# Patient Record
Sex: Male | Born: 1990 | Race: White | Hispanic: No | Marital: Single | Smoking: Never smoker
Health system: Southern US, Community
[De-identification: ages and names within clinical notes are randomized; demographics above are authoritative.]

## PROBLEM LIST (undated history)

## (undated) DIAGNOSIS — K589 Irritable bowel syndrome without diarrhea: Secondary | ICD-10-CM

## (undated) DIAGNOSIS — G8929 Other chronic pain: Secondary | ICD-10-CM

## (undated) DIAGNOSIS — F4310 Post-traumatic stress disorder, unspecified: Secondary | ICD-10-CM

## (undated) DIAGNOSIS — F32A Depression, unspecified: Secondary | ICD-10-CM

## (undated) DIAGNOSIS — F429 Obsessive-compulsive disorder, unspecified: Secondary | ICD-10-CM

## (undated) DIAGNOSIS — F329 Major depressive disorder, single episode, unspecified: Secondary | ICD-10-CM

## (undated) DIAGNOSIS — G4730 Sleep apnea, unspecified: Secondary | ICD-10-CM

## (undated) DIAGNOSIS — R51 Headache: Secondary | ICD-10-CM

## (undated) DIAGNOSIS — K219 Gastro-esophageal reflux disease without esophagitis: Secondary | ICD-10-CM

## (undated) DIAGNOSIS — Q213 Tetralogy of Fallot: Secondary | ICD-10-CM

## (undated) DIAGNOSIS — F419 Anxiety disorder, unspecified: Secondary | ICD-10-CM

## (undated) DIAGNOSIS — R5383 Other fatigue: Secondary | ICD-10-CM

## (undated) DIAGNOSIS — R519 Headache, unspecified: Secondary | ICD-10-CM

## (undated) DIAGNOSIS — Q249 Congenital malformation of heart, unspecified: Secondary | ICD-10-CM

## (undated) HISTORY — PX: OTHER SURGICAL HISTORY: SHX169

## (undated) HISTORY — DX: Headache, unspecified: R51.9

## (undated) HISTORY — DX: Other chronic pain: G89.29

## (undated) HISTORY — DX: Depression, unspecified: F32.A

## (undated) HISTORY — DX: Congenital malformation of heart, unspecified: Q24.9

## (undated) HISTORY — DX: Anxiety disorder, unspecified: F41.9

## (undated) HISTORY — PX: TETRALOGY OF FALLOT REPAIR: SHX796

## (undated) HISTORY — DX: Tetralogy of Fallot: Q21.3

## (undated) HISTORY — DX: Gastro-esophageal reflux disease without esophagitis: K21.9

## (undated) HISTORY — DX: Post-traumatic stress disorder, unspecified: F43.10

## (undated) HISTORY — DX: Headache: R51

## (undated) HISTORY — PX: TYMPANOSTOMY TUBE PLACEMENT: SHX32

## (undated) HISTORY — DX: Sleep apnea, unspecified: G47.30

## (undated) HISTORY — DX: Irritable bowel syndrome, unspecified: K58.9

## (undated) HISTORY — DX: Obsessive-compulsive disorder, unspecified: F42.9

## (undated) HISTORY — DX: Major depressive disorder, single episode, unspecified: F32.9

## (undated) HISTORY — DX: Other fatigue: R53.83

---
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
## 2012-12-18 ENCOUNTER — Emergency Department (HOSPITAL_BASED_OUTPATIENT_CLINIC_OR_DEPARTMENT_OTHER)
Admission: EM | Admit: 2012-12-18 | Discharge: 2012-12-19 | Disposition: A | Discharge status: Home / Self Care | Payer: PRIVATE HEALTH INSURANCE | Attending: Emergency Medicine | Admitting: Emergency Medicine

## 2012-12-18 ENCOUNTER — Encounter (HOSPITAL_BASED_OUTPATIENT_CLINIC_OR_DEPARTMENT_OTHER): Payer: Self-pay | Admitting: *Deleted

## 2012-12-18 DIAGNOSIS — F411 Generalized anxiety disorder: Secondary | ICD-10-CM | POA: Insufficient documentation

## 2012-12-18 DIAGNOSIS — F329 Major depressive disorder, single episode, unspecified: Secondary | ICD-10-CM | POA: Insufficient documentation

## 2012-12-18 DIAGNOSIS — Z8679 Personal history of other diseases of the circulatory system: Secondary | ICD-10-CM | POA: Insufficient documentation

## 2012-12-18 DIAGNOSIS — R319 Hematuria, unspecified: Secondary | ICD-10-CM | POA: Insufficient documentation

## 2012-12-18 DIAGNOSIS — G4730 Sleep apnea, unspecified: Secondary | ICD-10-CM | POA: Insufficient documentation

## 2012-12-18 DIAGNOSIS — K219 Gastro-esophageal reflux disease without esophagitis: Secondary | ICD-10-CM | POA: Insufficient documentation

## 2012-12-18 DIAGNOSIS — F429 Obsessive-compulsive disorder, unspecified: Secondary | ICD-10-CM | POA: Insufficient documentation

## 2012-12-18 DIAGNOSIS — K5900 Constipation, unspecified: Secondary | ICD-10-CM | POA: Insufficient documentation

## 2012-12-18 DIAGNOSIS — F4310 Post-traumatic stress disorder, unspecified: Secondary | ICD-10-CM | POA: Insufficient documentation

## 2012-12-18 DIAGNOSIS — R1110 Vomiting, unspecified: Secondary | ICD-10-CM | POA: Insufficient documentation

## 2012-12-18 DIAGNOSIS — R109 Unspecified abdominal pain: Secondary | ICD-10-CM

## 2012-12-18 DIAGNOSIS — F3289 Other specified depressive episodes: Secondary | ICD-10-CM | POA: Insufficient documentation

## 2012-12-18 DIAGNOSIS — Z79899 Other long term (current) drug therapy: Secondary | ICD-10-CM | POA: Insufficient documentation

## 2012-12-18 MED ORDER — KETOROLAC TROMETHAMINE 30 MG/ML IJ SOLN
30.0000 mg | Freq: Once | INTRAMUSCULAR | Status: AC
  Administered 2012-12-19: 30 mg via INTRAVENOUS
  Filled 2012-12-18: qty 1

## 2012-12-18 MED ORDER — ONDANSETRON HCL 4 MG/2ML IJ SOLN
4.0000 mg | Freq: Once | INTRAMUSCULAR | Status: AC
  Administered 2012-12-19: 4 mg via INTRAVENOUS
  Filled 2012-12-18: qty 2

## 2012-12-18 NOTE — ED Notes (Signed)
Pt c/o right flank pain with vomiting x 2 days

## 2012-12-19 ENCOUNTER — Emergency Department (HOSPITAL_BASED_OUTPATIENT_CLINIC_OR_DEPARTMENT_OTHER): Payer: PRIVATE HEALTH INSURANCE

## 2012-12-19 LAB — CBC WITH DIFFERENTIAL/PLATELET
Basophils Absolute: 0 10*3/uL (ref 0.0–0.1)
HCT: 40.9 % (ref 39.0–52.0)
Lymphocytes Relative: 18 % (ref 12–46)
Monocytes Absolute: 1.1 10*3/uL — ABNORMAL HIGH (ref 0.1–1.0)
Neutro Abs: 5.7 10*3/uL (ref 1.7–7.7)
Platelets: 171 10*3/uL (ref 150–400)
RDW: 13.2 % (ref 11.5–15.5)
WBC: 8.2 10*3/uL (ref 4.0–10.5)

## 2012-12-19 LAB — URINALYSIS, ROUTINE W REFLEX MICROSCOPIC
Glucose, UA: NEGATIVE mg/dL
Leukocytes, UA: NEGATIVE
Protein, ur: NEGATIVE mg/dL

## 2012-12-19 LAB — COMPREHENSIVE METABOLIC PANEL
ALT: 12 U/L (ref 0–53)
Albumin: 4.1 g/dL (ref 3.5–5.2)
Calcium: 9.4 mg/dL (ref 8.4–10.5)
GFR calc Af Amer: >90 mL/min (ref >90)
Glucose, Bld: 91 mg/dL (ref 70–99)
Potassium: 3.4 mEq/L — ABNORMAL LOW (ref 3.5–5.1)
Sodium: 140 mEq/L (ref 135–145)
Total Protein: 6.9 g/dL (ref 6.0–8.3)

## 2012-12-19 LAB — URINE MICROSCOPIC-ADD ON

## 2012-12-19 MED ORDER — NAPROXEN 500 MG PO TABS: 500.0000 mg | ORAL_TABLET | Freq: Two times a day (BID) | ORAL | Status: DC

## 2012-12-19 MED ORDER — METHOCARBAMOL 500 MG PO TABS: 500.0000 mg | ORAL_TABLET | Freq: Two times a day (BID) | ORAL | Status: DC

## 2012-12-19 MED ORDER — POLYETHYLENE GLYCOL 3350 17 GM/SCOOP PO POWD: 17.0000 g | Freq: Every day | ORAL | Status: DC

## 2012-12-19 NOTE — ED Provider Notes (Signed)
CSN: [id number]     Arrival date & time 12/18/12  2335 History   First MD Initiated Contact with Patient 12/18/12 2341     Chief Complaint  Patient presents with  . Flank Pain   (Consider location/radiation/quality/duration/timing/severity/associated sxs/prior Treatment) Patient is a 22 y.o. male presenting with flank pain. The history is provided by the patient.  Flank Pain This is a new problem. The current episode started 2 days ago. The problem occurs constantly. The problem has not changed since onset.Pertinent negatives include no abdominal pain, no headaches and no shortness of breath. Nothing aggravates the symptoms. Nothing relieves the symptoms. He has tried nothing for the symptoms. The treatment provided no relief.  Has had vomiting, no diarrhea.  No urinary symptoms.  No trauma.  No travel  Past Medical History  Diagnosis Date  . 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 OCD (obsessive compulsive disorder)   . PTSD (post-traumatic stress disorder)   . Allergic rhinitis   . GERD (gastroesophageal reflux disease)   . Fatigue   . Anxiety   . Chronic headaches   . Depression   . IBS (irritable bowel syndrome)   . Sleep apnea    Past Surgical History  Procedure Laterality Date  . Tetralogy of fallot repair    . Tympanostomy tube placement    . Lip biopsy      left lower   Family History  Problem Relation Age of Onset  . Heart disease    . Irritable bowel syndrome     History  Substance Use Topics  . Smoking status: Never Smoker   . Smokeless tobacco: Not on file  . Alcohol Use: No    Review of Systems  HENT: Negative for sore throat.   Respiratory: Negative for cough and shortness of breath.   Gastrointestinal: Positive for vomiting. Negative for abdominal pain, diarrhea and constipation.  Genitourinary: Positive for flank pain. Negative for frequency and hematuria.  Neurological: Negative for headaches.  All other systems reviewed and are negative.    Allergies  Codeine;  Lorazepam; and Morphine and related  Home Medications   Current Outpatient Rx  Name  Route  Sig  Dispense  Refill  . diphenhydrAMINE (SOMINEX) 25 MG tablet   Oral   Take 25 mg by mouth at bedtime as needed.           Marland Kitchen omeprazole (PRILOSEC) 20 MG capsule   Oral   Take 20 mg by mouth daily.           Marland Kitchen PARoxetine (PAXIL) 10 MG tablet   Oral   Take 10 mg by mouth every morning.            BP 124/72  Pulse 79  Temp(Src) 99.2 F (37.3 C) (Oral)  Resp 16  Ht 5\' 7"  (1.702 m)  Wt 165 lb (74.844 kg)  BMI 25.84 kg/m2  SpO2 100% Physical Exam  Constitutional: He is oriented to person, place, and time. He appears well-developed and well-nourished. No distress.  HENT:  Head: Normocephalic and atraumatic.  Mouth/Throat: Oropharynx is clear and moist.  Eyes: Conjunctivae are normal. Pupils are equal, round, and reactive to light.  Neck: Normal range of motion. Neck supple.  Cardiovascular: Normal rate, regular rhythm and intact distal pulses.   Pulmonary/Chest: Effort normal and breath sounds normal. He has no wheezes. He has no rales.  Abdominal: Soft. Bowel sounds are normal. There is no tenderness. There is no rebound and no guarding.  Musculoskeletal: Normal range of  motion.  Neurological: He is alert and oriented to person, place, and time.  Skin: Skin is warm and dry.  Psychiatric: He has a normal mood and affect.    ED Course  Procedures (including critical care time) Labs Review Labs Reviewed  URINALYSIS, ROUTINE W REFLEX MICROSCOPIC - Abnormal; Notable for the following:    Hgb urine dipstick TRACE (*)    All other components within normal limits  URINE MICROSCOPIC-ADD ON  CBC WITH DIFFERENTIAL  COMPREHENSIVE METABOLIC PANEL   Imaging Review No results found.  MDM  No diagnosis found. DDx Kidney stone Muscle strain secondary to retching from vomiting  Will treat for muscle strain and constipation and refer to urology for microscopic hematuria.      Jasmine Awe, MD 12/19/12 [phone]

## 2014-06-16 DIAGNOSIS — Z9889 Other specified postprocedural states: Secondary | ICD-10-CM | POA: Insufficient documentation

## 2014-06-16 DIAGNOSIS — Q213 Tetralogy of Fallot: Secondary | ICD-10-CM | POA: Insufficient documentation

## 2014-07-19 ENCOUNTER — Ambulatory Visit (INDEPENDENT_AMBULATORY_CARE_PROVIDER_SITE_OTHER): Payer: Self-pay | Admitting: Sports Medicine

## 2014-07-19 VITALS — BP 116/72 | HR 96 | Temp 98.2°F | Resp 16 | Ht 66.5 in | Wt 185.2 lb

## 2014-07-19 DIAGNOSIS — J069 Acute upper respiratory infection, unspecified: Secondary | ICD-10-CM

## 2014-07-19 MED ORDER — AZITHROMYCIN 250 MG PO TABS: ORAL_TABLET | ORAL | Status: DC

## 2014-07-19 NOTE — Progress Notes (Signed)
   Subjective:    Patient ID: Andre Baker, male    DOB: 12-21-1990, 24 y.o.   MRN: [id number]  HPI Andre Baker is a 24 year-old male who presents with cough, nasal congestion, body aches, and fatigue. Onset was 9 days ago. Cough has been mildly productive of clear to yellowish sputum. Tried taking dayquil, tessalon perles. Tmax 101 on day 2, no fevers since then. Eating and drinking OK. Frontal sinus pressure, mild to moderate. No shortness of breath, chest pressure, or swelling. No neck pain, nausea, or headache. No abdominal pain.  Currently working for [employer], requests work note excusing him due to his illness.    Past Medical History  Diagnosis Date  . 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 OCD (obsessive compulsive disorder)   . PTSD (post-traumatic stress disorder)   . Allergic rhinitis   . GERD (gastroesophageal reflux disease)   . Fatigue   . Anxiety   . Chronic headaches   . Depression   . IBS (irritable bowel syndrome)   . 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   Allergies  Allergen Reactions  . Codeine   . Lorazepam   . Morphine And Related    History   Social History  . Marital Status: Single    Spouse Name: N/A  . Number of Children: N/A  . Years of Education: N/A   Occupational History  . Not on file.   Social History Main Topics  . Smoking status: Never Smoker   . Smokeless tobacco: Not on file  . Alcohol Use: No  . Drug Use: No  . Sexual Activity: Not on file   Other Topics Concern  . Not on file   Social History Narrative    Review of Systems 7 point review of systems was performed and was otherwise negative unless noted in the history of present illness.     Objective:   Physical Exam BP 116/72 mmHg  Pulse 96  Temp(Src) 98.2 F (36.8 C) (Oral)  Resp 16  Ht 5' 6.5" (1.689 m)  Wt 185 lb 3.2 oz (84.006 kg)  BMI 29.45 kg/m2  SpO2 98% General appearance: alert, cooperative and appears stated age Head:  Normocephalic, without obvious abnormality, atraumatic Eyes: conjunctivae/corneas clear. PERRL, EOM's intact. Fundi benign. Ears: normal TM's and external ear canals both ears Nose: clear discharge, mild congestion, turbinates red, swollen Throat: mild right tonsillar +1 enlargement without purulence, no uvular deviation Neck: mild anterior cervical adenopathy, no carotid bruit, no JVD, supple, symmetrical, trachea midline and thyroid not enlarged, symmetric, no tenderness/mass/nodules Lungs: clear to auscultation bilaterally Heart: systolic murmur: systolic ejection 3/6, crescendo and decrescendo at apex, mid-sternal post-surgical scar Abdomen: soft, non-tender; bowel sounds normal; no masses,  no organomegaly Extremities: extremities normal, atraumatic, no cyanosis or edema Pulses: 2+ and symmetric Skin: Skin color, texture, turgor normal. No rashes or lesions     Assessment & Plan:  1. URI, possibly early sinusitis  -Discussed supportive cares, warm humidified air, PO hydration, nasal saline irrigation. Tylenol and ibuprofen as needed for fever or body aches.  -Note provided excusing from work today and tomorrow. -Provided Rx for a Z-pack if his sinus symptoms persist or worsen. -If noticing increasing cough, fever >102, shortness of breath, wheezing, lethargy, or for any other concerns, then return to the clinic or go to the emergency department. Patient verbalized understanding and agreement. -Follow-up if needed.  Dr. Joellyn HaffPick-Jacobs, DO Sports Medicine Fellow  -

## 2014-07-19 NOTE — Patient Instructions (Signed)
Upper Respiratory Infection, Adult An upper respiratory infection (URI) is also sometimes known as the common cold. The upper respiratory tract includes the nose, sinuses, throat, trachea, and bronchi. Bronchi are the airways leading to the lungs. Most people improve within 1 week, but symptoms can last up to 2 weeks. A residual cough may last even longer.  CAUSES Many different viruses can infect the tissues lining the upper respiratory tract. The tissues become irritated and inflamed and often become very moist. Mucus production is also common. A cold is contagious. You can easily spread the virus to others by oral contact. This includes kissing, sharing a glass, coughing, or sneezing. Touching your mouth or nose and then touching a surface, which is then touched by another person, can also spread the virus. SYMPTOMS  Symptoms typically develop 1 to 3 days after you come in contact with a cold virus. Symptoms vary from person to person. They may include:  Runny nose.  Sneezing.  Nasal congestion.  Sinus irritation.  Sore throat.  Loss of voice (laryngitis).  Cough.  Fatigue.  Muscle aches.  Loss of appetite.  Headache.  Low-grade fever. DIAGNOSIS  You might diagnose your own cold based on familiar symptoms, since most people get a cold 2 to 3 times a year. Your caregiver can confirm this based on your exam. Most importantly, your caregiver can check that your symptoms are not due to another disease such as strep throat, sinusitis, pneumonia, asthma, or epiglottitis. Blood tests, throat tests, and X-rays are not necessary to diagnose a common cold, but they may sometimes be helpful in excluding other more serious diseases. Your caregiver will decide if any further tests are required. RISKS AND COMPLICATIONS  You may be at risk for a more severe case of the common cold if you smoke cigarettes, have chronic heart disease (such as heart failure) or lung disease (such as asthma), or if  you have a weakened immune system. The very young and very old are also at risk for more serious infections. Bacterial sinusitis, middle ear infections, and bacterial pneumonia can complicate the common cold. The common cold can worsen asthma and chronic obstructive pulmonary disease (COPD). Sometimes, these complications can require emergency medical care and may be life-threatening. PREVENTION  The best way to protect against getting a cold is to practice good hygiene. Avoid oral or hand contact with people with cold symptoms. Wash your hands often if contact occurs. There is no clear evidence that vitamin C, vitamin E, echinacea, or exercise reduces the chance of developing a cold. However, it is always recommended to get plenty of rest and practice good nutrition. TREATMENT  Treatment is directed at relieving symptoms. There is no cure. Antibiotics are not effective, because the infection is caused by a virus, not by bacteria. Treatment may include:  Increased fluid intake. Sports drinks offer valuable electrolytes, sugars, and fluids.  Breathing heated mist or steam (vaporizer or shower).  Eating chicken soup or other clear broths, and maintaining good nutrition.  Getting plenty of rest.  Using gargles or lozenges for comfort.  Controlling fevers with ibuprofen or acetaminophen as directed by your caregiver.  Increasing usage of your inhaler if you have asthma. Zinc gel and zinc lozenges, taken in the first 24 hours of the common cold, can shorten the duration and lessen the severity of symptoms. Pain medicines may help with fever, muscle aches, and throat pain. A variety of non-prescription medicines are available to treat congestion and runny nose. Your caregiver   can make recommendations and may suggest nasal or lung inhalers for other symptoms.  HOME CARE INSTRUCTIONS   Only take over-the-counter or prescription medicines for pain, discomfort, or fever as directed by your  caregiver.  Use a warm mist humidifier or inhale steam from a shower to increase air moisture. This may keep secretions moist and make it easier to breathe.  Drink enough water and fluids to keep your urine clear or pale yellow.  Rest as needed.  Return to work when your temperature has returned to normal or as your caregiver advises. You may need to stay home longer to avoid infecting others. You can also use a face mask and careful hand washing to prevent spread of the virus. SEEK MEDICAL CARE IF:   After the first few days, you feel you are getting worse rather than better.  You need your caregiver's advice about medicines to control symptoms.  You develop chills, worsening shortness of breath, or brown or red sputum. These may be signs of pneumonia.  You develop yellow or brown nasal discharge or pain in the face, especially when you bend forward. These may be signs of sinusitis.  You develop a fever, swollen neck glands, pain with swallowing, or white areas in the back of your throat. These may be signs of strep throat. SEEK IMMEDIATE MEDICAL CARE IF:   You have a fever.  You develop severe or persistent headache, ear pain, sinus pain, or chest pain.  You develop wheezing, a prolonged cough, cough up blood, or have a change in your usual mucus (if you have chronic lung disease).  You develop sore muscles or a stiff neck. Document Released: 09/25/2000 Document Revised: 06/24/2011 Document Reviewed: 07/07/2013 Exit[employer] Patient Information 2015 [employer]. This information is not intended to replace advice given to you by your health care provider. Make sure you discuss any questions you have with your health care provider.  

## 2014-09-14 DIAGNOSIS — I519 Heart disease, unspecified: Secondary | ICD-10-CM | POA: Insufficient documentation

## 2014-10-27 DIAGNOSIS — I371 Nonrheumatic pulmonary valve insufficiency: Secondary | ICD-10-CM | POA: Insufficient documentation

## 2014-10-27 DIAGNOSIS — F819 Developmental disorder of scholastic skills, unspecified: Secondary | ICD-10-CM | POA: Insufficient documentation

## 2014-11-02 IMAGING — CT CT ABD-PELV W/O CM
2 of 4 series · 17 of 46 positions shown, 19 images · non-contrast
Comparison: None.

CLINICAL DATA: Right flank pain and vomiting for 2 days.

CT ABDOMEN AND PELVIS WITHOUT CONTRAST
TECHNIQUE: Multidetector CT imaging of the abdomen and pelvis was
performed following the standard protocol without intravenous
contrast.

[Series 2: renal stone < 200 lbs 5.0 b31f · axial · 0.74mm/px · z∈[-521,-71]mm · 14 of 98 slices shown, 16 images]
[im 4/98  soft-tissue]
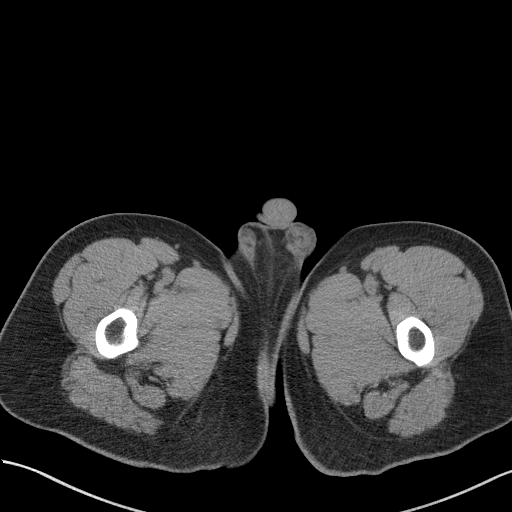
[im 4/98  bone]
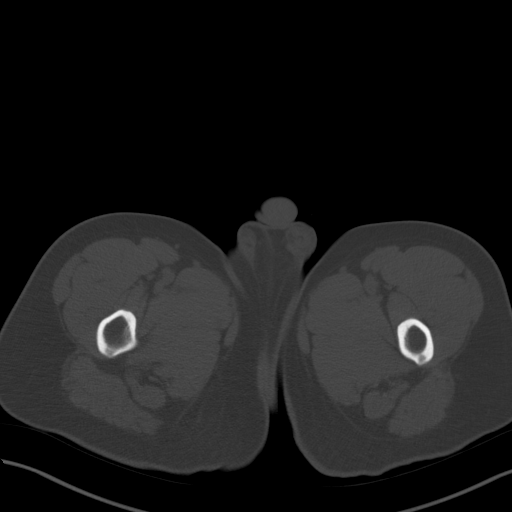
[im 12/98  soft-tissue]
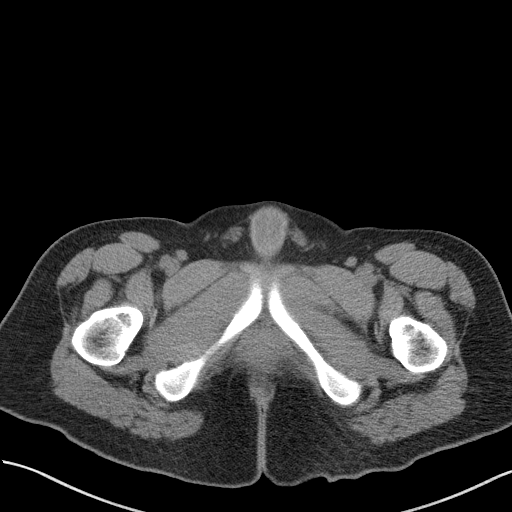
[im 20/98  soft-tissue]
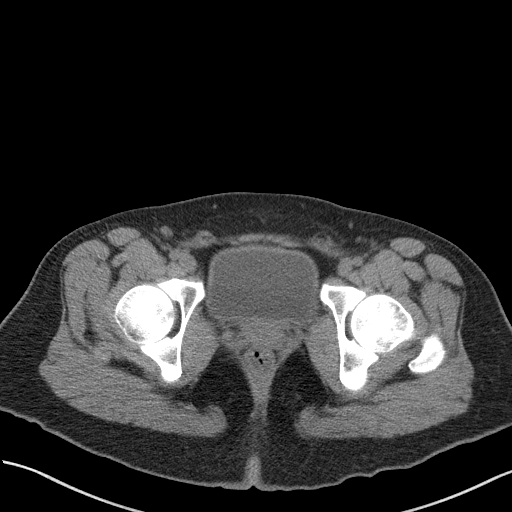
[im 28/98  soft-tissue]
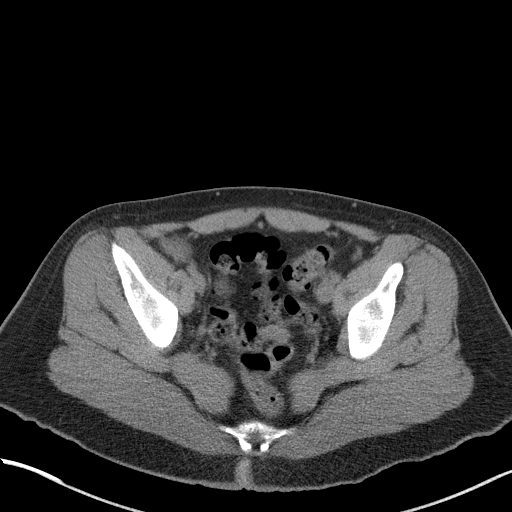
[im 32/98  soft-tissue]
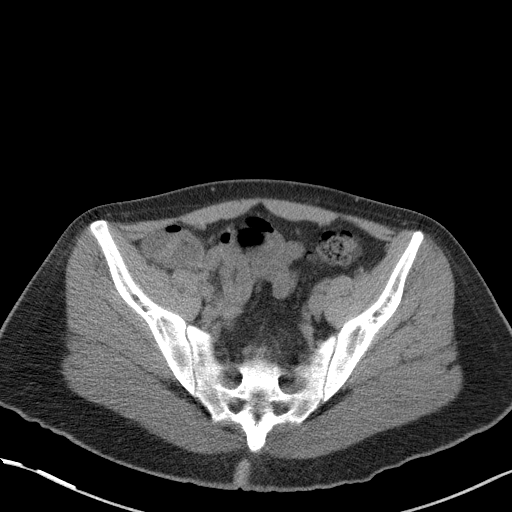
[im 39/98  soft-tissue]
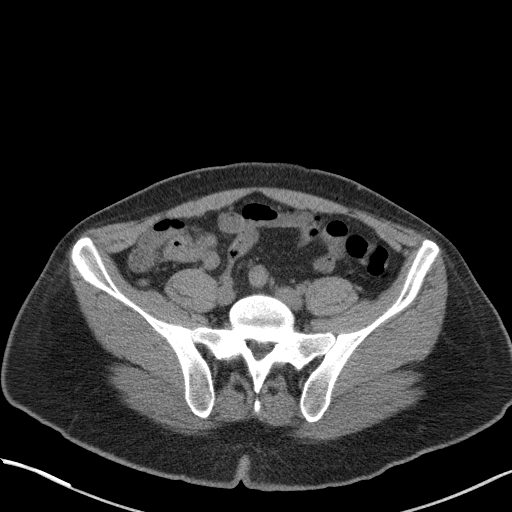
[im 47/98  soft-tissue]
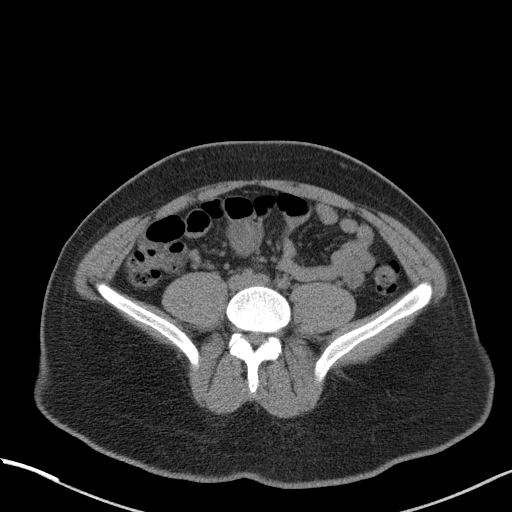
[im 51/98  soft-tissue]
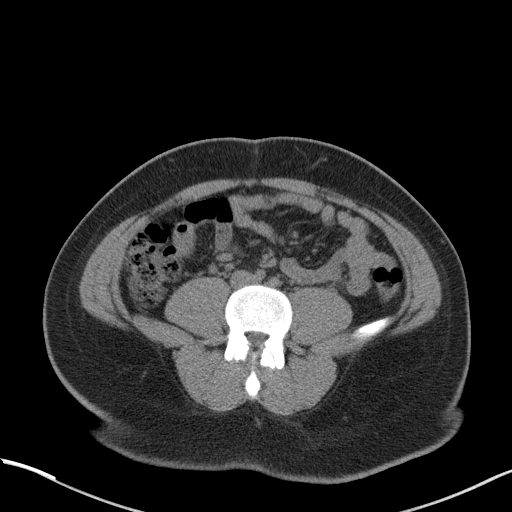
[im 59/98  soft-tissue]
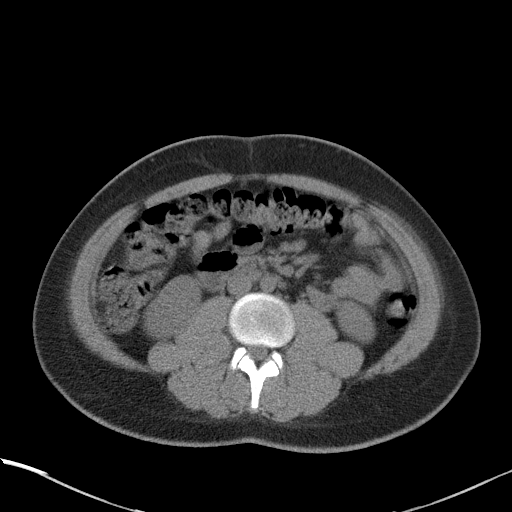
[im 59/98  bone]
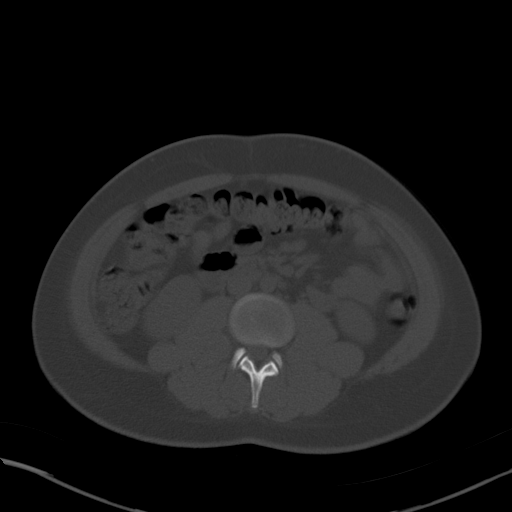
[im 66/98  soft-tissue]
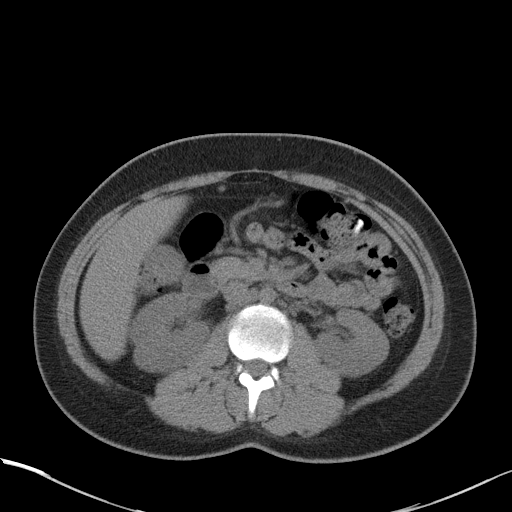
[im 74/98  soft-tissue]
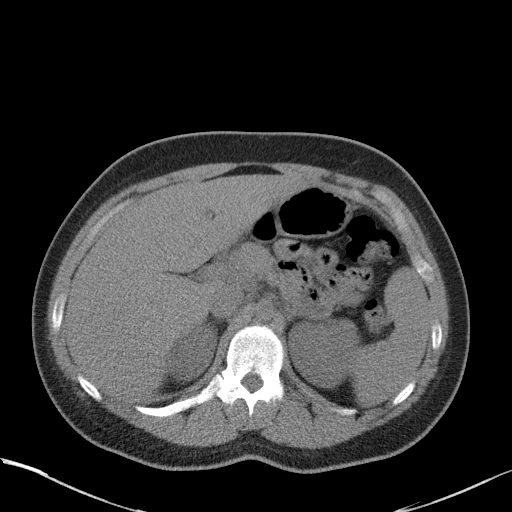
[im 78/98  soft-tissue]
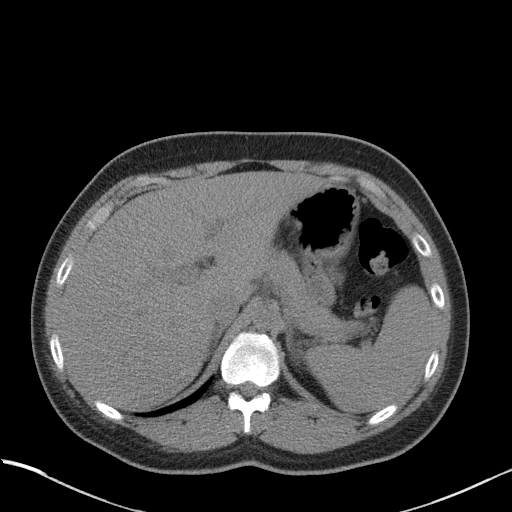
[im 86/98  soft-tissue]
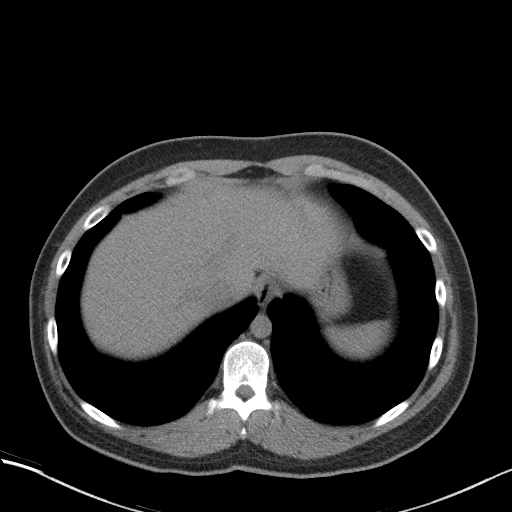
[im 94/98  soft-tissue]
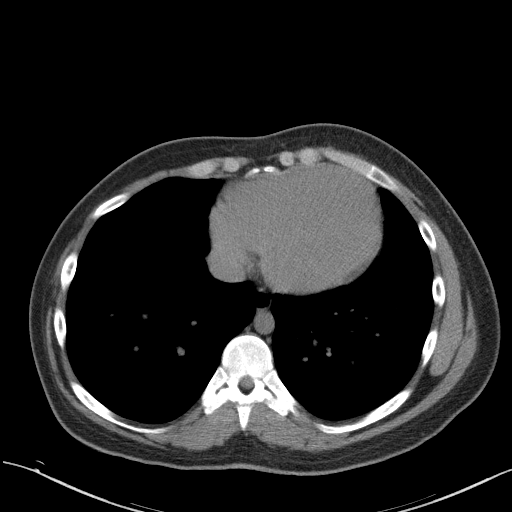

[Series 5: renal stone 3.0 coronal · coronal · 0.83mm/px · 3 of 77 slices shown]
[im 26/77  soft-tissue]
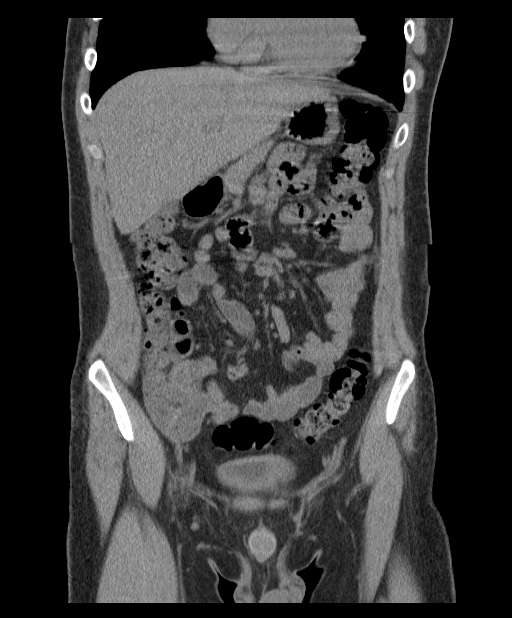
[im 34/77  soft-tissue]
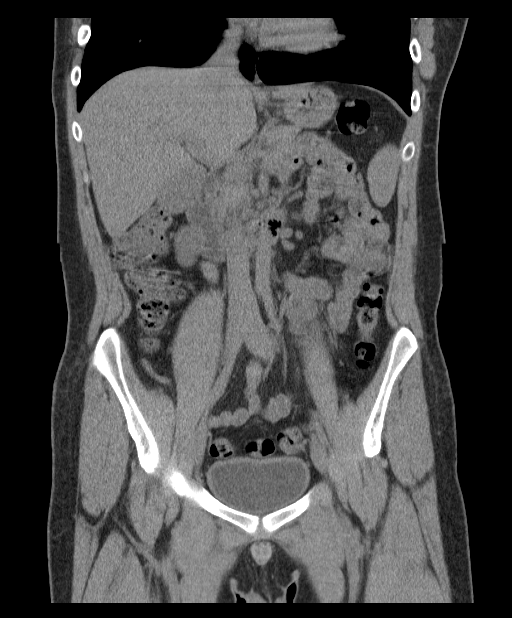
[im 43/77  soft-tissue]
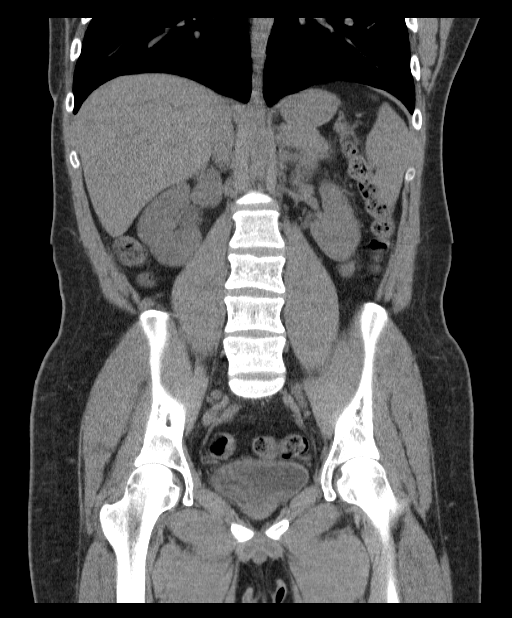

[17 of 46 positions shown; findings below may reference images not displayed]

FINDINGS: The lung bases are clear.

The kidneys appear symmetrical in size and shape.  No
pyelocaliectasis or ureterectasis.  No renal, ureteral, or bladder
stones.  No significant bladder wall thickening.

The unenhanced appearance of the liver, spleen, gallbladder,
pancreas, adrenal glands, abdominal aorta, inferior vena cava, and
retroperitoneal lymph nodes is unremarkable.  Stomach and small
bowel are decompressed.  Stool filled colon without distension.  No
free air or free fluid in the abdomen.

Pelvis:  The appendix is normal.  Stool filled rectosigmoid colon
without inflammatory change.  Prostate gland is not enlarged.  No
free or loculated pelvic fluid collections.  No significant pelvic
lymphadenopathy.  Normal alignment of the lumbar spine.  No
destructive bone lesions appreciated.
IMPRESSION: No renal or ureteral stone or obstruction.

## 2015-03-27 ENCOUNTER — Ambulatory Visit (INDEPENDENT_AMBULATORY_CARE_PROVIDER_SITE_OTHER): Payer: PRIVATE HEALTH INSURANCE | Admitting: Internal Medicine

## 2015-03-27 ENCOUNTER — Encounter: Payer: Self-pay | Admitting: Internal Medicine

## 2015-03-27 VITALS — BP 112/70 | HR 81 | Ht 66.5 in | Wt 165.0 lb

## 2015-03-27 DIAGNOSIS — R0600 Dyspnea, unspecified: Secondary | ICD-10-CM | POA: Insufficient documentation

## 2015-03-27 LAB — NITRIC OXIDE: NITRIC OXIDE: 19

## 2015-03-27 MED ORDER — MOMETASONE FURO-FORMOTEROL FUM 100-5 MCG/ACT IN AERO: INHALATION_SPRAY | RESPIRATORY_TRACT | Status: DC

## 2015-03-27 NOTE — Patient Instructions (Addendum)
There is very little evidence that you have asthma , but if you do it is only related to heavy exercise  To sort this out, try dulera 100 one or two puffs 15 min before your next work out to see what difference this makes and if it helps a lot it's ok to continue to take it pre-heavy exercise.   The flow volume loop we did suggests pseuodoasthma that can be seen in reflux and anxiety disorders but neither this nor exercise induced asthma are a contra indication to heart surgery which you should tolerate well   Take prilosec Take 30-60 min before first meal of the day   GERD (REFLUX)  is an extremely common cause of respiratory symptoms just like yours , many times with no obvious heartburn at all.    It can be treated with medication, but also with lifestyle changes including elevation of the head of your bed (ideally with 6 inch  bed blocks),  Smoking cessation, avoidance of late meals, excessive alcohol, and avoid fatty foods, chocolate, peppermint, colas, red wine, and acidic juices such as orange juice.  NO MINT OR MENTHOL PRODUCTS SO NO COUGH DROPS  USE SUGARLESS CANDY INSTEAD (Jolley ranchers or Stover's or Life Savers) or even ice chips will also do - the key is to swallow to prevent all throat clearing. NO OIL BASED VITAMINS - use powdered substitutes.    Pulmonary follow up is as needed

## 2015-03-27 NOTE — Progress Notes (Signed)
Subjective:     Patient ID: Andre Baker, male   DOB: 1991-04-15, 24 y.o.   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 Not on vent but stayed in neonatal unit and went home on the due date and never had good ex tolerance [facility] where they are planning to do pulmonic valve replacement in January 2016 to address [facility] suggesting a ventilatory limit was reached   03/27/2015 1st [facility] office visit/ Mayukha Symmonds   Chief Complaint  Patient presents with  . Pulmonary Consult    Self referral for pulmonary clearance for pulmonary valve placement.   doe x jogging / ok on a flat surface / some problem on steps = baseline x years  No change with environment/ night vs day / no assoc cough  Once a week does a treadmill 10 min x 3.5 x 7grade then really tired /sob but no cough or wheeze noted  No obvious day to day or daytime variability or assoc excess/ purulent sputum or mucus plugs  or cp or chest tightness, subjective wheeze or overt sinus or hb symptoms. No unusual exp hx or h/o childhood pna/ asthma or knowledge of premature birth.  Sleeping ok without nocturnal  or early am exacerbation  of respiratory  c/o's or need for noct saba. Also denies any obvious fluctuation of symptoms with weather or environmental changes or other aggravating or alleviating factors except as outlined above   Current Medications, Allergies, Complete Past Medical History, Past Surgical History, Family History, and Social History were reviewed in [facility] electronic medical record.  ROS  The following are not active complaints unless bolded sore throat, dysphagia, dental problems, itching, sneezing,  nasal congestion or excess/ purulent secretions, ear ache,   fever, chills, sweats, unintended wt loss, classically pleuritic or exertional cp, hemoptysis,  orthopnea pnd or leg swelling,  presyncope, palpitations, abdominal pain, anorexia, nausea, vomiting, diarrhea  or change in bowel or bladder habits, change in stools or urine, dysuria,hematuria,  rash, arthralgias, visual complaints, headache, numbness, weakness or ataxia or problems with walking or coordination,  change in mood/affect or memory.         Review of Systems     Objective:   Physical Exam    Pleasant but quite anxious young man nad with mild  variable pseudowheeze    Wt Readings from Last 3 Encounters:  03/27/15 165 lb (74.844 kg)  07/19/14 185 lb 3.2 oz (84.006 kg)  12/18/12 165 lb (74.844 kg)    Vital signs reviewed  HEENT: nl dentition, turbinates, and oropharynx. Nl external ear canals without cough reflex   NECK :  without JVD/Nodes/TM/ nl carotid upstrokes bilaterally   LUNGS: no acc muscle use,  Nl contour chest which is clear to A and P bilaterally without cough on insp or exp maneuvers   CV:  RRR  no s3  II/VI sem, no increase in P2, no edema   ABD:  soft and nontender with nl inspiratory excursion in the supine position. No bruits or organomegaly, bowel sounds nl  MS:  Nl gait/ ext warm without deformities, calf tenderness, cyanosis or clubbing No obvious joint restrictions   SKIN: warm and dry without lesions    NEURO:  alert, approp, nl sensorium with  no motor deficits   No recent cxr on file  Assessment:

## 2015-03-29 ENCOUNTER — Encounter: Payer: Self-pay | Admitting: Internal Medicine

## 2015-03-29 NOTE — Assessment & Plan Note (Addendum)
Echo 03/08/15 NORMAL LEFT VENTRICULAR SYSTOLIC FUNCTION WITH MILD LVH NORMAL LA PRESSURES WITH NORMAL DIASTOLIC FUNCTION MILD RV SYSTOLIC DYSFUNCTION (See above) VALVULAR REGURGITATION: TRIVIAL MR, SEVERE PR, MILD TR NO VALVULAR STENOSIS -S/P VSD CLOSURE WITH OUTFLOW PATCH; NO RESIDUAL VSD SEEN. - NORMAL MAIN AND BRANCH PULMONARY ARTERIES. [facility] 03/08/15  Nl pre ex fev1/fvc but Ve/MVV = 80% with post ex sustained drop in FEV1> FVC but no f/v loop available Spirometry 03/27/2015  Non-physilogic FV loop that could not be reproduced as pt gave out after several tries and none showed a fixed defect NO 03/27/2015 = 19   I have [facility] and agree that there appears to be a ventilatory limitation on his exercise test. However, his normal resting spirometry pre-test with sustained reduction in both FEV1 and FVC post test and his pattern of nonphysiologic obstruction present today do not point to a mechanical or physiologic problem here. 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 Because his resting study is normal is not possible for him to have tracheal stenosis from previous surgery although I did entertain that possibility.  I see no contraindication to proceeding with PVR but would suggest the following in the meantime:  rec ? Acid (or non-acid) GERD > always difficult to exclude as up to 75% of pts in some series report no assoc GI/ Heartburn symptoms> rec max (24h)  acid suppression and diet restrictions/ reviewed and instructions given in writing.   ? EIA > I doubt this is present but there is no way to rule out as a coexisting factor that can't be documented unless we do the repeat study with flow volume loops before-and-after exercise. What I would do is suggest to use either one or 2  pffs of dulera 100 15 minutes before exercise to see if it makes any difference in  his exercise tolerance and if makes no difference stop it   Total time devoted to counseling  = 35/7477m review case with pt/mother  discussion of options/alternatives/ giving and going over instructions (see avs)

## 2015-04-14 ENCOUNTER — Institutional Professional Consult (permissible substitution): Payer: PRIVATE HEALTH INSURANCE | Admitting: Internal Medicine

## 2015-05-15 HISTORY — PX: OTHER SURGICAL HISTORY: SHX169

## 2015-07-11 ENCOUNTER — Telehealth: Payer: Self-pay | Admitting: Internal Medicine

## 2015-07-11 NOTE — Telephone Encounter (Signed)
lmtcb for pt.  

## 2015-07-12 NOTE — Telephone Encounter (Signed)
Spoke with pt. Advised him that MW wanted him to follow up as needed and that if he wanted to continue receiving samples he would need to make an appointment. Samples have been left at the front desk for pick up. Nothing further was needed.

## 2015-07-12 NOTE — Telephone Encounter (Signed)
Per 03/27/15: Patient Instructions       There is very little evidence that you have asthma , but if you do it is only related to heavy exercise  To sort this out, try dulera 100 one or two puffs 15 min before your next work out to see what difference this makes and if it helps a lot it's ok to continue to take it pre-heavy exercise.   The flow volume loop we did suggests pseuodoasthma that can be seen in reflux and anxiety disorders but neither this nor exercise induced asthma are a contra indication to heart surgery which you should tolerate well   Take prilosec Take 30-60 min before first meal of the day   GERD (REFLUX)  is an extremely common cause of respiratory symptoms just like yours , many times with no obvious heartburn at all.    It can be treated with medication, but also with lifestyle changes including elevation of the head of your bed (ideally with 6 inch  bed blocks),  Smoking cessation, avoidance of late meals, excessive alcohol, and avoid fatty foods, chocolate, peppermint, colas, red wine, and acidic juices such as orange juice.   NO MINT OR MENTHOL PRODUCTS SO NO COUGH DROPS  USE SUGARLESS CANDY INSTEAD (Jolley ranchers or Stover's or Life Savers) or even ice chips will also do - the key is to swallow to prevent all throat clearing. NO OIL BASED VITAMINS - use powdered substitutes.   Pulmonary follow up is as needed    ----  LMOMTCB x1

## 2015-07-12 NOTE — Telephone Encounter (Signed)
Patient returned call, CB is [phone].

## 2015-09-20 DIAGNOSIS — Z953 Presence of xenogenic heart valve: Secondary | ICD-10-CM | POA: Insufficient documentation

## 2015-10-31 ENCOUNTER — Encounter (HOSPITAL_COMMUNITY): Payer: Self-pay

## 2015-10-31 ENCOUNTER — Encounter (HOSPITAL_COMMUNITY)
Admission: RE | Admit: 2015-10-31 | Discharge: 2015-10-31 | Disposition: A | Discharge status: Home / Self Care | Payer: Medicaid Other | Source: Ambulatory Visit | Attending: Cardiology | Admitting: Cardiology

## 2015-10-31 VITALS — BP 122/60 | HR 88 | Ht 68.25 in | Wt 191.4 lb

## 2015-10-31 DIAGNOSIS — Z952 Presence of prosthetic heart valve: Secondary | ICD-10-CM

## 2015-10-31 DIAGNOSIS — Z954 Presence of other heart-valve replacement: Secondary | ICD-10-CM | POA: Insufficient documentation

## 2015-10-31 NOTE — Progress Notes (Signed)
Cardiac Rehab Medication Review by a Pharmacist  Does the patient  feel that his/her medications are working for him/her?  yes  Has the patient been experiencing any side effects to the medications prescribed?  Yes. Paxil causes heartburn, treated with OTC tums PRN, omeprazole daily, pepto bismol PRN.  Does the patient measure his/her own blood pressure or blood glucose at home?  no   Does the patient have any problems obtaining medications due to transportation or finances?   no  Understanding of regimen: excellent Understanding of indications: excellent Potential of compliance: excellent  Pharmacist comments: 25 yo male ambulates without assistance, accompanied by mother. Reviewed medications and allergies with patient who presented a list of home medications for reference. Patient verbalized excellent understanding of medication regimen. Per pt, no longer taking Dulera as this was PRN prior to surgery and pt has not required since. Added aspirin, several homeopathic supplements and allergy immunotherapy SL tablets to med list.  Patient carries med list with extensive ingredient contents of each supplement. Of note, pt takes 0.5 mL (1 mg) of paroxetine suspension in addition to paroxetine 10 mg PO tablet for total daily dose of 11 mg.   Allena Katzaroline E Jatara Huettner, PharmD PGY1 Pharmacy Resident 10/31/2015 2:06 PM

## 2015-10-31 NOTE — Progress Notes (Signed)
Cardiac Individual Treatment Plan  Patient Details  Name: Andre Baker MRN: [id number] Date of Birth: [birth year] Referring Provider:        CARDIAC REHAB PHASE II ORIENTATION from 10/31/2015 in [place]   Referring Provider  Jeralyn Bennett, MD      Initial Encounter Date:       CARDIAC REHAB PHASE II ORIENTATION from 10/31/2015 in [place]   Date  10/31/15   Referring Provider  Jeralyn Bennett, MD      Visit Diagnosis: S/P pulmonary valve replacement  Patient's Home Medications on Admission:  Current outpatient prescriptions:  .  aspirin 81 MG tablet, Take 81 mg by mouth daily., Disp: , Rfl:  .  bismuth subsalicylate (PEPTO BISMOL) 262 MG chewable tablet, Chew 262 mg by mouth as needed., Disp: , Rfl:  .  calcium carbonate (TUMS - DOSED IN MG ELEMENTAL CALCIUM) 500 MG chewable tablet, Chew 1 tablet by mouth 3 (three) times daily as needed. , Disp: , Rfl:  .  co-enzyme Q-10 30 MG capsule, Take 30 mg by mouth daily., Disp: , Rfl:  .  diphenhydrAMINE (SOMINEX) 25 MG tablet, Take 25 mg by mouth 2 (two) times daily as needed for allergies. , Disp: , Rfl:  .  fexofenadine (ALLEGRA) 180 MG tablet, Take 180 mg by mouth daily., Disp: , Rfl:  .  Guaifenesin (MUCINEX MAXIMUM STRENGTH) 1200 MG TB12, Take 1 tablet by mouth 2 (two) times daily as needed. , Disp: , Rfl:  .  loperamide (IMODIUM) 2 MG capsule, Take 2 mg by mouth as needed for diarrhea or loose stools., Disp: , Rfl:  .  Multiple Vitamin (MULTIVITAMIN) capsule, Take 1 capsule by mouth daily., Disp: , Rfl:  .  NONFORMULARY OR COMPOUNDED ITEM, Place 1 drop under the tongue 3 (three) times daily. Allergy immunotherapy, Disp: , Rfl:  .  NONFORMULARY OR COMPOUNDED ITEM, Take 1 tablet by mouth daily. "Ultra Hepa Trope II Dietary Supplement" produced by [employer]. Includes taurine + NAC, Disp: , Rfl:  .  NONFORMULARY OR COMPOUNDED ITEM, Take 1 tablet by mouth  daily. "Inflamed" produced by [employer]. Includes tumeric., Disp: , Rfl:  .  Nutritional Supplements (RA MELATONIN/B-6 PO), Take 1 tablet by mouth at bedtime. , Disp: , Rfl:  .  omeprazole (PRILOSEC) 20 MG capsule, Take 20 mg by mouth daily.  , Disp: , Rfl:  .  PARoxetine (PAXIL) 10 MG tablet, Take 10 mg by mouth every morning. Total daily dose = 66m, Disp: , Rfl:  .  paroxetine (PAXIL) 10 MG/5ML suspension, Take 1 mg by mouth every morning. Total daily dose = 153m Disp: , Rfl:  .  saccharomyces boulardii (FLORASTOR) 250 MG capsule, Take 250 mg by mouth daily., Disp: , Rfl:  .  TraZODone & Diet Manage Prod (TRAZAMINE PO), Take 37.5 mg by mouth daily., Disp: , Rfl:  .  mometasone-formoterol (DULERA) 100-5 MCG/ACT AERO, 2 pffs x 15 min before exercise (Patient not taking: Reported on 10/31/2015), Disp: 1 Inhaler, Rfl: 1  Past Medical History: Past Medical History  Diagnosis Date  . 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 OCD (obsessive compulsive disorder)   . PTSD (post-traumatic stress disorder)   . Allergic rhinitis   . GERD (gastroesophageal reflux disease)   . Fatigue   . Anxiety   . Chronic headaches   . Depression   . IBS (irritable bowel syndrome)   . Sleep apnea   . 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 Never Smoker   Smokeless tobacco  . Not on file    Labs:     Recent Review Flowsheet Data    There is no flowsheet data to display.      Capillary Blood Glucose: No results found for: GLUCAP   Exercise Target Goals: Date: 10/31/15  Exercise Program Goal: Individual exercise prescription set with THRR, safety & activity barriers. Participant demonstrates ability to understand and report RPE using BORG scale, to self-measure pulse accurately, and to acknowledge the importance of the exercise prescription.  Exercise Prescription Goal: Starting with aerobic activity 30 plus minutes a day, 3 days per week for  initial exercise prescription. Provide home exercise prescription and guidelines that participant acknowledges understanding prior to discharge.  Activity Barriers & Risk Stratification:     Activity Barriers & Cardiac Risk Stratification - 10/31/15 1355    Activity Barriers & Cardiac Risk Stratification   Activity Barriers None   Cardiac Risk Stratification Low      6 Minute Walk:     6 Minute Walk      10/31/15 1603       6 Minute Walk   Phase Initial     Distance 2182 feet     Walk Time 6 minutes     # of Rest Breaks 0     MPH 4.13     METS 7.53     RPE 14     VO2 Peak 26.35     Symptoms No     Resting HR 88 bpm     Resting BP 122/60 mmHg     Max Ex. HR 146 bpm     Max Ex. BP 144/80 mmHg     2 Minute Post BP 129/68 mmHg        Initial Exercise Prescription:     Initial Exercise Prescription - 10/31/15 1600    Date of Initial Exercise RX and Referring Provider   Date 10/31/15   Referring Provider Jeralyn Bennett, MD   Treadmill   MPH 2.5   Grade 2   Minutes 10   METs 3.6   Bike   Level 1.2   Minutes 10   METs 3.6   NuStep   Level 4   Minutes 10   METs 3   Prescription Details   Frequency (times per week) 3   Duration Progress to 30 minutes of continuous aerobic without signs/symptoms of physical distress   Intensity   THRR 40-80% of Max Heartrate 79-158   Ratings of Perceived Exertion 11-13   Perceived Dyspnea 0-4   Progression   Progression Continue to progress workloads to maintain intensity without signs/symptoms of physical distress.   Resistance Training   Training Prescription Yes   Weight 3 lbs   Reps 10-12      Perform Capillary Blood Glucose checks as needed.  Exercise Prescription Changes:   Exercise Comments:   Discharge Exercise Prescription (Final Exercise Prescription Changes):   Nutrition:  Target Goals: Understanding of nutrition guidelines, daily intake of sodium <1531m, cholesterol <2072m calories 30% from  fat and 7% or less from saturated fats, daily to have 5 or more servings of fruits and vegetables.  Biometrics:     Pre Biometrics - 10/31/15 1555    Pre Biometrics   Height 5' 8.25" (1.734 m)   Weight 191 lb 5.8 oz (86.8 kg)  Waist Circumference 34 inches   Hip Circumference 41.25 inches   Waist to Hip Ratio 0.82 %   BMI (Calculated) 28.9   Triceps Skinfold 25 mm   % Body Fat 25.7 %   Grip Strength 48 kg   Flexibility 16.5 in   Single Leg Stand 30 seconds       Nutrition Therapy Plan and Nutrition Goals:   Nutrition Discharge: Nutrition Scores:   Nutrition Goals Re-Evaluation:   Psychosocial: Target Goals: Acknowledge presence or absence of depression, maximize coping skills, provide positive support system. Participant is able to verbalize types and ability to use techniques and skills needed for reducing stress and depression.  Initial Review & Psychosocial Screening:     Initial Psych Review & Screening - 10/31/15 [place]? Yes   Barriers   Psychosocial barriers to participate in program There are no identifiable barriers or psychosocial needs.   Screening Interventions   Interventions Encouraged to exercise      Quality of Life Scores:     Quality of Life - 10/31/15 1603    Quality of Life Scores   Health/Function Pre 7.58 %   Socioeconomic Pre 14.5 %   Psych/Spiritual Pre 6.21 %   Family Pre 24 %   GLOBAL Pre 10.52 %      PHQ-9:     Recent Review Flowsheet Data    There is no flowsheet data to display.      Psychosocial Evaluation and Intervention:   Psychosocial Re-Evaluation:   Vocational Rehabilitation: Provide vocational rehab assistance to qualifying candidates.   Vocational Rehab Evaluation & Intervention:     Vocational Rehab - 10/31/15 1643    Initial Vocational Rehab Evaluation & Intervention   Assessment shows need for Vocational Rehabilitation Yes   Vocational Rehab Packet given  to patient 10/31/15  Patient given vocational rehab packet to take home      Education: Education Goals: Education classes will be provided on a weekly basis, covering required topics. Participant will state understanding/return demonstration of topics presented.  Learning Barriers/Preferences:     Learning Barriers/Preferences - 10/31/15 1601    Learning Barriers/Preferences   Learning Barriers None   Learning Preferences Audio;Individual Instruction;Video;Verbal Instruction      Education Topics: Count Your Pulse:  -Group instruction provided by verbal instruction, demonstration, patient participation and written materials to support subject.  Instructors address importance of being able to find your pulse and how to count your pulse when at home without a heart monitor.  Patients get hands on experience counting their pulse with staff help and individually.   Heart Attack, Angina, and Risk Factor Modification:  -Group instruction provided by verbal instruction, video, and written materials to support subject.  Instructors address signs and symptoms of angina and heart attacks.    Also discuss risk factors for heart disease and how to make changes to improve heart health risk factors.   Functional Fitness:  -Group instruction provided by verbal instruction, demonstration, patient participation, and written materials to support subject.  Instructors address safety measures for doing things around the house.  Discuss how to get up and down off the floor, how to pick things up properly, how to safely get out of a chair without assistance, and balance training.   Meditation and Mindfulness:  -Group instruction provided by verbal instruction, patient participation, and written materials to support subject.  Instructor addresses importance of mindfulness and meditation practice to help reduce stress and  improve awareness.  Instructor also leads participants through a meditation exercise.     Stretching for Flexibility and Mobility:  -Group instruction provided by verbal instruction, patient participation, and written materials to support subject.  Instructors lead participants through series of stretches that are designed to increase flexibility thus improving mobility.  These stretches are additional exercise for major muscle groups that are typically performed during regular warm up and cool down.   Hands Only CPR Anytime:  -Group instruction provided by verbal instruction, video, patient participation and written materials to support subject.  Instructors co-teach with AHA video for hands only CPR.  Participants get hands on experience with mannequins.   Nutrition I class: Heart Healthy Eating:  -Group instruction provided by PowerPoint slides, verbal discussion, and written materials to support subject matter. The instructor gives an explanation and review of the Therapeutic Lifestyle Changes diet recommendations, which includes a discussion on lipid goals, dietary fat, sodium, fiber, plant stanol/sterol esters, sugar, and the components of a well-balanced, healthy diet.   Nutrition II class: Lifestyle Skills:  -Group instruction provided by PowerPoint slides, verbal discussion, and written materials to support subject matter. The instructor gives an explanation and review of label reading, grocery shopping for heart health, heart healthy recipe modifications, and ways to make healthier choices when eating out.   Diabetes Question & Answer:  -Group instruction provided by PowerPoint slides, verbal discussion, and written materials to support subject matter. The instructor gives an explanation and review of diabetes co-morbidities, pre- and post-prandial blood glucose goals, pre-exercise blood glucose goals, signs, symptoms, and treatment of hypoglycemia and hyperglycemia, and foot care basics.   Diabetes Blitz:  -Group instruction provided by PowerPoint slides, verbal  discussion, and written materials to support subject matter. The instructor gives an explanation and review of the physiology behind type 1 and type 2 diabetes, diabetes medications and rational behind using different medications, pre- and post-prandial blood glucose recommendations and Hemoglobin A1c goals, diabetes diet, and exercise including blood glucose guidelines for exercising safely.    Portion Distortion:  -Group instruction provided by PowerPoint slides, verbal discussion, written materials, and food models to support subject matter. The instructor gives an explanation of serving size versus portion size, changes in portions sizes over the last 20 years, and what consists of a serving from each food group.   Stress Management:  -Group instruction provided by verbal instruction, video, and written materials to support subject matter.  Instructors review role of stress in heart disease and how to cope with stress positively.     Exercising on Your Own:  -Group instruction provided by verbal instruction, power point, and written materials to support subject.  Instructors discuss benefits of exercise, components of exercise, frequency and intensity of exercise, and end points for exercise.  Also discuss use of nitroglycerin and activating EMS.  Review options of places to exercise outside of rehab.  Review guidelines for sex with heart disease.   Cardiac Drugs I:  -Group instruction provided by verbal instruction and written materials to support subject.  Instructor reviews cardiac drug classes: antiplatelets, anticoagulants, beta blockers, and statins.  Instructor discusses reasons, side effects, and lifestyle considerations for each drug class.   Cardiac Drugs II:  -Group instruction provided by verbal instruction and written materials to support subject.  Instructor reviews cardiac drug classes: angiotensin converting enzyme inhibitors (ACE-I), angiotensin II receptor blockers (ARBs),  nitrates, and calcium channel blockers.  Instructor discusses reasons, side effects, and lifestyle considerations for each drug class.  Anatomy and Physiology of the Circulatory System:  -Group instruction provided by verbal instruction, video, and written materials to support subject.  Reviews functional anatomy of heart, how it relates to various diagnoses, and what role the heart plays in the overall system.   Knowledge Questionnaire Score:     Knowledge Questionnaire Score - 10/31/15 1602    Knowledge Questionnaire Score   Pre Score 18/24      Core Components/Risk Factors/Patient Goals at Admission:     Personal Goals and Risk Factors at Admission - 10/31/15 1608    Core Components/Risk Factors/Patient Goals on Admission   Sedentary Yes   Intervention Provide advice, education, support and counseling about physical activity/exercise needs.;Develop an individualized exercise prescription for aerobic and resistive training based on initial evaluation findings, risk stratification, comorbidities and participant's personal goals.   Expected Outcomes Achievement of increased cardiorespiratory fitness and enhanced flexibility, muscular endurance and strength shown through measurements of functional capacity and personal statement of participant.   Increase Strength and Stamina Yes   Intervention Provide advice, education, support and counseling about physical activity/exercise needs.;Develop an individualized exercise prescription for aerobic and resistive training based on initial evaluation findings, risk stratification, comorbidities and participant's personal goals.   Expected Outcomes Achievement of increased cardiorespiratory fitness and enhanced flexibility, muscular endurance and strength shown through measurements of functional capacity and personal statement of participant.   Personal Goal Other Yes   Personal Goal Increase exercise tolerance. be able to perform ADL's. Return to  work.   Intervention Develop individualized aerobic and resistive training to improve cardiorespiratory fitness, improve strength and stamina, and increase functional capacity to achieve goals.   Expected Outcomes Achievement of increased functional capacity and MET level to perform ADL's and work activities.      Core Components/Risk Factors/Patient Goals Review:    Core Components/Risk Factors/Patient Goals at Discharge (Final Review):    ITP Comments:     ITP Comments      10/31/15 1324           ITP Comments Medical Director- Dr. Fransico Him, MD          Comments: Patient attended orientation from 1330 to 1500 to review rules and guidelines for program. Completed 6 minute walk test, Intitial ITP, and exercise prescription.  VSS. Telemetry-Sinus rhythm with a bundle branch block this has bee previously documented.  Asymptomatic. Andre Baker has been referred to cardiac rehab by Dr Corine Shelter at [facility]. Andre Baker had his pulmonary  Replacement valve surgery at [facility] in January of 2017. Andre Pall, RN,BSN 11/02/2015 10:40 AM

## 2015-11-06 ENCOUNTER — Encounter (HOSPITAL_COMMUNITY)
Admission: RE | Admit: 2015-11-06 | Discharge: 2015-11-06 | Disposition: A | Discharge status: Home / Self Care | Payer: Medicaid Other | Source: Ambulatory Visit | Attending: Cardiology | Admitting: Cardiology

## 2015-11-06 DIAGNOSIS — Z952 Presence of prosthetic heart valve: Secondary | ICD-10-CM

## 2015-11-06 NOTE — Progress Notes (Signed)
Daily Session Note  Patient Details  Name: SARGON SCOUTEN MRN: [id number] Date of Birth: 05-Sep-1990 Referring Provider:   Flowsheet Row CARDIAC REHAB PHASE II ORIENTATION from 10/31/2015 in [place]  Referring Provider  Jeralyn Bennett, MD      Encounter Date: 11/06/2015  Check In:     Session Check In - 11/06/15 1338      Check-In   Location [facility]   Staff Present Seward Carol, MS, ACSM CEP, Exercise Physiologist;Areebah Meinders, RN, BSN;Amber Fair, MS, ACSM RCEP, Exercise Physiologist   Supervising physician immediately available to respond to emergencies [facility] immediately available   Physician(s) Dr. Marily Memos   Medication changes reported     No   Fall or balance concerns reported    No   Warm-up and Cool-down Performed as group-led instruction   Resistance Training Performed Yes   VAD Patient? No     Pain Assessment   Currently in Pain? No/denies      Capillary Blood Glucose: No results found for this or any previous visit (from the past 24 hour(s)).   Goals Met:  Exercise tolerated well  Goals Unmet:  Not Applicable  Comments: Pt started cardiac rehab today.  Pt tolerated light exercise without difficulty. VSS, telemetry-Sinus rhythm with a bundle branch block, asymptomatic.  Medication list reconciled. Pt denies barriers to medicaiton compliance.  PSYCHOSOCIAL ASSESSMENT:  PHQ-1.Burrel told me he has some depression at times but denies feeling depressed at this time. Jonahtan takes an antidepressant. Pt exhibits positive coping skills, hopeful outlook with supportive family. No psychosocial needs identified at this time, no psychosocial interventions necessary.    Pt enjoys writing..   Pt oriented to exercise equipment and routine.    Understanding verbalized.Quintez reviewed the vocational rehab packet and said he does not need vocational rehab at this time. Barnet Pall, RN,BSN 11/06/2015 4:51  PM   Dr. Fransico Him is Medical Director for Cardiac Rehab at [facility].

## 2015-11-08 ENCOUNTER — Encounter (HOSPITAL_COMMUNITY)
Admission: RE | Admit: 2015-11-08 | Discharge: 2015-11-08 | Disposition: A | Discharge status: Home / Self Care | Payer: Self-pay | Source: Ambulatory Visit | Attending: Cardiology | Admitting: Cardiology

## 2015-11-08 DIAGNOSIS — Z952 Presence of prosthetic heart valve: Secondary | ICD-10-CM

## 2015-11-10 ENCOUNTER — Encounter (HOSPITAL_COMMUNITY)
Admission: RE | Admit: 2015-11-10 | Discharge: 2015-11-10 | Disposition: A | Discharge status: Home / Self Care | Payer: Medicaid Other | Source: Ambulatory Visit | Attending: Cardiology | Admitting: Cardiology

## 2015-11-10 DIAGNOSIS — Z952 Presence of prosthetic heart valve: Secondary | ICD-10-CM

## 2015-11-13 ENCOUNTER — Encounter (HOSPITAL_COMMUNITY)
Admission: RE | Admit: 2015-11-13 | Discharge: 2015-11-13 | Disposition: A | Discharge status: Home / Self Care | Payer: Medicaid Other | Source: Ambulatory Visit | Attending: Cardiology | Admitting: Cardiology

## 2015-11-13 DIAGNOSIS — Z952 Presence of prosthetic heart valve: Secondary | ICD-10-CM

## 2015-11-15 ENCOUNTER — Encounter (HOSPITAL_COMMUNITY)
Admission: RE | Admit: 2015-11-15 | Discharge: 2015-11-15 | Disposition: A | Discharge status: Home / Self Care | Payer: Medicaid Other | Source: Ambulatory Visit | Attending: Cardiology | Admitting: Cardiology

## 2015-11-15 DIAGNOSIS — Z954 Presence of other heart-valve replacement: Secondary | ICD-10-CM | POA: Insufficient documentation

## 2015-11-15 DIAGNOSIS — Z952 Presence of prosthetic heart valve: Secondary | ICD-10-CM

## 2015-11-15 NOTE — Progress Notes (Signed)
Reviewed home exercise with pt today.  [facility]. Pt will use stationary bike, treadmill and elliptical and exercise on Tues./Thurs./Sat.  Reviewed THR, pulse, RPE, sign and symptoms, and when to call 911 or MD.  Also discussed weather considerations and indoor options.  Pt voiced understanding.    Andre Baker [employer]

## 2015-11-17 ENCOUNTER — Encounter (HOSPITAL_COMMUNITY)
Admission: RE | Admit: 2015-11-17 | Discharge: 2015-11-17 | Disposition: A | Discharge status: Home / Self Care | Payer: Medicaid Other | Source: Ambulatory Visit | Attending: Cardiology | Admitting: Cardiology

## 2015-11-17 DIAGNOSIS — Z952 Presence of prosthetic heart valve: Secondary | ICD-10-CM

## 2015-11-20 ENCOUNTER — Telehealth (HOSPITAL_COMMUNITY): Payer: Self-pay | Admitting: *Deleted

## 2015-11-20 ENCOUNTER — Encounter (HOSPITAL_COMMUNITY): Admission: RE | Admit: 2015-11-20 | Payer: Medicaid Other | Source: Ambulatory Visit

## 2015-11-22 ENCOUNTER — Encounter (HOSPITAL_COMMUNITY)
Admission: RE | Admit: 2015-11-22 | Discharge: 2015-11-22 | Disposition: A | Discharge status: Home / Self Care | Payer: Medicaid Other | Source: Ambulatory Visit | Attending: Cardiology | Admitting: Cardiology

## 2015-11-22 DIAGNOSIS — Z952 Presence of prosthetic heart valve: Secondary | ICD-10-CM

## 2015-11-24 ENCOUNTER — Encounter (HOSPITAL_COMMUNITY)
Admission: RE | Admit: 2015-11-24 | Discharge: 2015-11-24 | Disposition: A | Discharge status: Home / Self Care | Payer: Self-pay | Source: Ambulatory Visit | Attending: Cardiology | Admitting: Cardiology

## 2015-11-24 DIAGNOSIS — Z952 Presence of prosthetic heart valve: Secondary | ICD-10-CM

## 2015-11-27 ENCOUNTER — Encounter (HOSPITAL_COMMUNITY)
Admission: RE | Admit: 2015-11-27 | Discharge: 2015-11-27 | Disposition: A | Discharge status: Home / Self Care | Payer: Medicaid Other | Source: Ambulatory Visit | Attending: Cardiology | Admitting: Cardiology

## 2015-11-27 DIAGNOSIS — Z952 Presence of prosthetic heart valve: Secondary | ICD-10-CM

## 2015-11-29 ENCOUNTER — Encounter (HOSPITAL_COMMUNITY)
Admission: RE | Admit: 2015-11-29 | Discharge: 2015-11-29 | Disposition: A | Discharge status: Home / Self Care | Payer: Medicaid Other | Source: Ambulatory Visit | Attending: Cardiology | Admitting: Cardiology

## 2015-11-29 DIAGNOSIS — Z952 Presence of prosthetic heart valve: Secondary | ICD-10-CM

## 2015-12-01 ENCOUNTER — Encounter (HOSPITAL_COMMUNITY)
Admission: RE | Admit: 2015-12-01 | Discharge: 2015-12-01 | Disposition: A | Discharge status: Home / Self Care | Payer: Self-pay | Source: Ambulatory Visit | Attending: Cardiology | Admitting: Cardiology

## 2015-12-01 DIAGNOSIS — Z952 Presence of prosthetic heart valve: Secondary | ICD-10-CM

## 2015-12-01 NOTE — Progress Notes (Signed)
Andre Baker 25 y.o. male Nutrition Note Spoke with pt. Nutrition Survey reviewed with pt. Pt is not currently following the Therapeutic Lifestyle Changes diet. Pt is in the contemplative state of change. Pt expressed understanding of the information reviewed. Pt aware of nutrition education classes offered. No results found for: HGBA1C Wt Readings from Last 3 Encounters:  10/31/15 191 lb 5.8 oz (86.8 kg)  03/27/15 165 lb (74.8 kg)  07/19/14 185 lb 3.2 oz (84 kg)   Nutrition Diagnosis ? Food-and nutrition-related knowledge deficit related to lack of exposure to information as related to diagnosis of: ? CVD ? Overweight related to excessive energy intake as evidenced by a BMI of 28.9 Nutrition Intervention ? Benefits of adopting Therapeutic Lifestyle Changes discussed when Med[employer] reviewed. ? Pt to attend the Portion Distortion class ? Pt given handouts for: ? Nutrition I class ? Nutrition II class  ? Continue client-centered nutrition education by RD, as part of interdisciplinary care. Goal(s) ? Pt to identify and limit food sources of saturated fat, trans fat, and sodium ? Pt to identify food quantities necessary to achieve weight loss of 6-24 lb (2.7-10.9 kg) at graduation from cardiac rehab.   Monitor and Evaluate progress toward nutrition goal with team.  Andre PlumbEdna Deagan Baker, M.Ed, RD, LDN, CDE 12/01/2015 2:38 PM

## 2015-12-01 NOTE — Progress Notes (Signed)
Cardiac Individual Treatment Plan  Patient Details  Name: Andre Baker MRN: [id number] Date of Birth: 1990-05-19 Referring Provider:   Flowsheet Row CARDIAC REHAB PHASE II ORIENTATION from 10/31/2015 in [place] [facility]  Referring Provider  Jeralyn Bennett, MD      Initial Encounter Date:  Flowsheet Row CARDIAC REHAB PHASE II ORIENTATION from 10/31/2015 in [facility]  Date  10/31/15  Referring Provider  Jeralyn Bennett, MD      Visit Diagnosis: S/P pulmonary valve replacement  Patient's Home Medications on Admission:  Current Outpatient Prescriptions:  .  aspirin 81 MG tablet, Take 81 mg by mouth daily., Disp: , Rfl:  .  bismuth subsalicylate (PEPTO BISMOL) 262 MG chewable tablet, Chew 262 mg by mouth as needed., Disp: , Rfl:  .  calcium carbonate (TUMS - DOSED IN MG ELEMENTAL CALCIUM) 500 MG chewable tablet, Chew 1 tablet by mouth 3 (three) times daily as needed. , Disp: , Rfl:  .  co-enzyme Q-10 30 MG capsule, Take 30 mg by mouth daily., Disp: , Rfl:  .  diphenhydrAMINE (SOMINEX) 25 MG tablet, Take 25 mg by mouth 2 (two) times daily as needed for allergies. , Disp: , Rfl:  .  fexofenadine (ALLEGRA) 180 MG tablet, Take 180 mg by mouth daily., Disp: , Rfl:  .  Guaifenesin (MUCINEX MAXIMUM STRENGTH) 1200 MG TB12, Take 1 tablet by mouth 2 (two) times daily as needed. , Disp: , Rfl:  .  loperamide (IMODIUM) 2 MG capsule, Take 2 mg by mouth as needed for diarrhea or loose stools., Disp: , Rfl:  .  mometasone-formoterol (DULERA) 100-5 MCG/ACT AERO, 2 pffs x 15 min before exercise (Patient not taking: Reported on 10/31/2015), Disp: 1 Inhaler, Rfl: 1 .  Multiple Vitamin (MULTIVITAMIN) capsule, Take 1 capsule by mouth daily., Disp: , Rfl:  .  NONFORMULARY OR COMPOUNDED ITEM, Place 1 drop under the tongue 3 (three) times daily. Allergy immunotherapy, Disp: , Rfl:  .  NONFORMULARY OR COMPOUNDED ITEM, Take 1 tablet by mouth daily.  "Ultra Hepa Trope II Dietary Supplement" produced by [employer]. Includes taurine + NAC, Disp: , Rfl:  .  NONFORMULARY OR COMPOUNDED ITEM, Take 1 tablet by mouth daily. "Inflamed" produced by [employer]. Includes tumeric., Disp: , Rfl:  .  Nutritional Supplements (RA MELATONIN/B-6 PO), Take 1 tablet by mouth at bedtime. , Disp: , Rfl:  .  omeprazole (PRILOSEC) 20 MG capsule, Take 20 mg by mouth daily.  , Disp: , Rfl:  .  PARoxetine (PAXIL) 10 MG tablet, Take 10 mg by mouth every morning. Total daily dose = 72m, Disp: , Rfl:  .  paroxetine (PAXIL) 10 MG/5ML suspension, Take 1 mg by mouth every morning. Total daily dose = 127m Disp: , Rfl:  .  saccharomyces boulardii (FLORASTOR) 250 MG capsule, Take 250 mg by mouth daily., Disp: , Rfl:  .  TraZODone & Diet Manage Prod (TRAZAMINE PO), Take 37.5 mg by mouth daily., Disp: , Rfl:   Past Medical History: Past Medical History:  Diagnosis Date  . Allergic rhinitis   . Anxiety   . 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 Chronic headaches   . Depression   . Fatigue   . GERD (gastroesophageal reflux disease)   . IBS (irritable bowel syndrome)   . OCD (obsessive compulsive disorder)   . PTSD (post-traumatic stress disorder)   . Sleep apnea   . 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 Never Smoker  Smokeless Tobacco  . Not on file    Labs: Recent Review Flowsheet Data    There is no flowsheet data to display.      Capillary Blood Glucose: No results found for: GLUCAP   Exercise Target Goals:    Exercise Program Goal: Individual exercise prescription set with THRR, safety & activity barriers. Participant demonstrates ability to understand and report RPE using BORG scale, to self-measure pulse accurately, and to acknowledge the importance of the exercise prescription.  Exercise Prescription Goal: Starting with aerobic activity 30 plus minutes a day, 3 days per week for initial  exercise prescription. Provide home exercise prescription and guidelines that participant acknowledges understanding prior to discharge.  Activity Barriers & Risk Stratification:     Activity Barriers & Cardiac Risk Stratification - 10/31/15 1355      Activity Barriers & Cardiac Risk Stratification   Activity Barriers None   Cardiac Risk Stratification Low      6 Minute Walk:     6 Minute Walk    Row Name 10/31/15 1603         6 Minute Walk   Phase Initial     Distance 2182 feet     Walk Time 6 minutes     # of Rest Breaks 0     MPH 4.13     METS 7.53     RPE 14     VO2 Peak 26.35     Symptoms No     Resting HR 88 bpm     Resting BP 122/60     Max Ex. HR 146 bpm     Max Ex. BP 144/80     2 Minute Post BP 129/68        Initial Exercise Prescription:     Initial Exercise Prescription - 10/31/15 1600      Date of Initial Exercise RX and Referring Provider   Date 10/31/15   Referring Provider Jeralyn Bennett, MD     Treadmill   MPH 2.5   Grade 2   Minutes 10   METs 3.6     Bike   Level 1.2   Minutes 10   METs 3.6     NuStep   Level 4   Minutes 10   METs 3     Prescription Details   Frequency (times per week) 3   Duration Progress to 30 minutes of continuous aerobic without signs/symptoms of physical distress     Intensity   THRR 40-80% of Max Heartrate 79-158   Ratings of Perceived Exertion 11-13   Perceived Dyspnea 0-4     Progression   Progression Continue to progress workloads to maintain intensity without signs/symptoms of physical distress.     Resistance Training   Training Prescription Yes   Weight 3 lbs   Reps 10-12      Perform Capillary Blood Glucose checks as needed.  Exercise Prescription Changes:     Exercise Prescription Changes    Row Name 11/10/15 1500 11/22/15 1700           Exercise Review   Progression Yes Yes        Response to Exercise   Blood Pressure (Admit) 120/86 120/82      Blood Pressure  (Exercise) 136/80 144/72      Blood Pressure (Exit) 118/80 100/58      Heart Rate (Admit) 95 bpm 105 bpm  Heart Rate (Exercise) 121 bpm 167 bpm      Heart Rate (Exit) 87 bpm 102 bpm      Rating of Perceived Exertion (Exercise) 14 13      Duration Progress to 30 minutes of continuous aerobic without signs/symptoms of physical distress Progress to 30 minutes of continuous aerobic without signs/symptoms of physical distress      Intensity THRR unchanged THRR unchanged        Progression   Progression Continue to progress workloads to maintain intensity without signs/symptoms of physical distress. Continue to progress workloads to maintain intensity without signs/symptoms of physical distress.      Average METs 3.6 3.9        Resistance Training   Training Prescription Yes Yes      Weight 5lbs 5lbs      Reps 10-12 10-12        Treadmill   MPH 2.5 2.8      Grade 2 2      Minutes 10 10      METs 3.6 3.91        Bike   Level 1.7 1.7      Minutes 10 10      METs 4.73 4.68        NuStep   Level 4 5      Minutes 10 10      METs 3.7 3.8         Exercise Comments:     Exercise Comments    Row Name 11/15/15 1444 11/24/15 1539         Exercise Comments Reviewed HEP with pt and [facility]urs[facility]Sat. Reviewed METs and goals, Pt is tolerating exercise well; will continue to monitor exercise progression         Discharge Exercise Prescription (Final Exercise Prescription Changes):     Exercise Prescription Changes - 11/22/15 1700      Exercise Review   Progression Yes     Response to Exercise   Blood Pressure (Admit) 120/82   Blood Pressure (Exercise) 144/72   Blood Pressure (Exit) 100/58   Heart Rate (Admit) 105 bpm   Heart Rate (Exercise) 167 bpm   Heart Rate (Exit) 102 bpm   Rating of Perceived Exertion (Exercise) 13   Duration Progress to 30 minutes of continuous aerobic without signs/symptoms of physical distress   Intensity  THRR unchanged     Progression   Progression Continue to progress workloads to maintain intensity without signs/symptoms of physical distress.   Average METs 3.9     Resistance Training   Training Prescription Yes   Weight 5lbs   Reps 10-12     Treadmill   MPH 2.8   Grade 2   Minutes 10   METs 3.91     Bike   Level 1.7   Minutes 10   METs 4.68     NuStep   Level 5   Minutes 10   METs 3.8      Nutrition:  Target Goals: Understanding of nutrition guidelines, daily intake of sodium <1510m, cholesterol <2020m calories 30% from fat and 7% or less from saturated fats, daily to have 5 or more servings of fruits and vegetables.  Biometrics:     Pre Biometrics - 10/31/15 1555      Pre Biometrics   Height 5' 8.25" (1.734 m)   Weight 191 lb 5.8 oz (86.8 kg)   Waist Circumference 34 inches   Hip  Circumference 41.25 inches   Waist to Hip Ratio 0.82 %   BMI (Calculated) 28.9   Triceps Skinfold 25 mm   % Body Fat 25.7 %   Grip Strength 48 kg   Flexibility 16.5 in   Single Leg Stand 30 seconds       Nutrition Therapy Plan and Nutrition Goals:     Nutrition Therapy & Goals - 11/09/15 1456      Nutrition Therapy   Diet General, Healthful diet     Personal Nutrition Goals   Personal Goal #1 1-2 lb wt loss/week to a goal wt loss of 6-24 lb at graduation from [facility], educate and counsel regarding individualized specific dietary modifications aiming towards targeted core components such as weight, hypertension, lipid management, diabetes, heart failure and other comorbidities.   Expected Outcomes Short Term Goal: Understand basic principles of dietary content, such as calories, fat, sodium, cholesterol and nutrients.;Long Term Goal: Adherence to prescribed nutrition plan.      Nutrition Discharge: Nutrition Scores:     Nutrition Assessments - 11/09/15 1456      MEDFICTS Scores   Pre Score 67  will verify  score with pt      Nutrition Goals Re-Evaluation:   Psychosocial: Target Goals: Acknowledge presence or absence of depression, maximize coping skills, provide positive support system. Participant is able to verbalize types and ability to use techniques and skills needed for reducing stress and depression.  Initial Review & Psychosocial Screening:     Initial Psych Review & Screening - 10/31/15 [place]? Yes     Barriers   Psychosocial barriers to participate in program There are no identifiable barriers or psychosocial needs.     Screening Interventions   Interventions Encouraged to exercise      Quality of Life Scores:     Quality of Life - 10/31/15 1603      Quality of Life Scores   Health/Function Pre 7.58 %   Socioeconomic Pre 14.5 %   Psych/Spiritual Pre 6.21 %   Family Pre 24 %   GLOBAL Pre 10.52 %      PHQ-9: Recent Review Flowsheet Data    Depression screen [facility] 2/9 11/06/2015 11/06/2015   Decreased Interest - 0   Down, Depressed, Hopeless (No Data)  1   PHQ - 2 Score - 1      Psychosocial Evaluation and Intervention:     Psychosocial Evaluation - 12/01/15 1143      Psychosocial Evaluation & Interventions   Interventions Encouraged to exercise with the program and follow exercise prescription   Comments pt demonstrates good coping skills with positive hopeful outlook.  pt is participating in group exercise program without difficulty.    Continued Psychosocial Services Needed No      Psychosocial Re-Evaluation:   Vocational Rehabilitation: Provide vocational rehab assistance to qualifying candidates.   Vocational Rehab Evaluation & Intervention:     Vocational Rehab - 11/06/15 1653      Initial Vocational Rehab Evaluation & Intervention   Assessment shows need for Vocational Rehabilitation No     Discharge Vocational Rehab   Discharge Vocational Rehabilitation --  Emilo does not want vocational  rehab at this time after looking at the packet.      Education: Education Goals: Education classes will be provided on a weekly basis, covering required topics. Participant will state understanding/return demonstration of  topics presented.  Learning Barriers/Preferences:     Learning Barriers/Preferences - 10/31/15 1601      Learning Barriers/Preferences   Learning Barriers None   Learning Preferences Audio;Individual Instruction;Video;Verbal Instruction      Education Topics: Count Your Pulse:  -Group instruction provided by verbal instruction, demonstration, patient participation and written materials to support subject.  Instructors address importance of being able to find your pulse and how to count your pulse when at home without a heart monitor.  Patients get hands on experience counting their pulse with staff help and individually.   Heart Attack, Angina, and Risk Factor Modification:  -Group instruction provided by verbal instruction, video, and written materials to support subject.  Instructors address signs and symptoms of angina and heart attacks.    Also discuss risk factors for heart disease and how to make changes to improve heart health risk factors.   Functional Fitness:  -Group instruction provided by verbal instruction, demonstration, patient participation, and written materials to support subject.  Instructors address safety measures for doing things around the house.  Discuss how to get up and down off the floor, how to pick things up properly, how to safely get out of a chair without assistance, and balance training.   Meditation and Mindfulness:  -Group instruction provided by verbal instruction, patient participation, and written materials to support subject.  Instructor addresses importance of mindfulness and meditation practice to help reduce stress and improve awareness.  Instructor also leads participants through a meditation exercise.    Stretching for  Flexibility and Mobility:  -Group instruction provided by verbal instruction, patient participation, and written materials to support subject.  Instructors lead participants through series of stretches that are designed to increase flexibility thus improving mobility.  These stretches are additional exercise for major muscle groups that are typically performed during regular warm up and cool down. Flowsheet Row CARDIAC REHAB PHASE II EXERCISE from 11/24/2015 in [facility]  Date  11/10/15  Instruction Review Code  2- meets goals/outcomes      Hands Only CPR Anytime:  -Group instruction provided by verbal instruction, video, patient participation and written materials to support subject.  Instructors co-teach with AHA video for hands only CPR.  Participants get hands on experience with mannequins.   Nutrition I class: Heart Healthy Eating:  -Group instruction provided by PowerPoint slides, verbal discussion, and written materials to support subject matter. The instructor gives an explanation and review of the Therapeutic Lifestyle Changes diet recommendations, which includes a discussion on lipid goals, dietary fat, sodium, fiber, plant stanol/sterol esters, sugar, and the components of a well-balanced, healthy diet.   Nutrition II class: Lifestyle Skills:  -Group instruction provided by PowerPoint slides, verbal discussion, and written materials to support subject matter. The instructor gives an explanation and review of label reading, grocery shopping for heart health, heart healthy recipe modifications, and ways to make healthier choices when eating out.   Diabetes Question & Answer:  -Group instruction provided by PowerPoint slides, verbal discussion, and written materials to support subject matter. The instructor gives an explanation and review of diabetes co-morbidities, pre- and post-prandial blood glucose goals, pre-exercise blood glucose goals, signs,  symptoms, and treatment of hypoglycemia and hyperglycemia, and foot care basics. Flowsheet Row CARDIAC REHAB PHASE II EXERCISE from 11/24/2015 in [facility]  Date  11/24/15  Educator  RD  Instruction Review Code  2- meets goals/outcomes      Diabetes Blitz:  -Group instruction  provided by PowerPoint slides, verbal discussion, and written materials to support subject matter. The instructor gives an explanation and review of the physiology behind type 1 and type 2 diabetes, diabetes medications and rational behind using different medications, pre- and post-prandial blood glucose recommendations and Hemoglobin A1c goals, diabetes diet, and exercise including blood glucose guidelines for exercising safely.    Portion Distortion:  -Group instruction provided by PowerPoint slides, verbal discussion, written materials, and food models to support subject matter. The instructor gives an explanation of serving size versus portion size, changes in portions sizes over the last 20 years, and what consists of a serving from each food group. Flowsheet Row CARDIAC REHAB PHASE II EXERCISE from 11/24/2015 in [place] [facility]  Date  11/22/15  Educator  RD  Instruction Review Code  2- meets goals/outcomes      Stress Management:  -Group instruction provided by verbal instruction, video, and written materials to support subject matter.  Instructors review role of stress in heart disease and how to cope with stress positively.   Flowsheet Row CARDIAC REHAB PHASE II EXERCISE from 11/24/2015 in [place]  Date  11/15/15  Educator  Andi Hence, RN  Instruction Review Code  2- meets goals/outcomes      Exercising on Your Own:  -Group instruction provided by verbal instruction, power point, and written materials to support subject.  Instructors discuss benefits of exercise, components of exercise, frequency and intensity of  exercise, and end points for exercise.  Also discuss use of nitroglycerin and activating EMS.  Review options of places to exercise outside of rehab.  Review guidelines for sex with heart disease. Flowsheet Row CARDIAC REHAB PHASE II EXERCISE from 11/24/2015 in [facility]  Date  11/08/15  Educator  Seward Carol  Instruction Review Code  2- meets goals/outcomes      Cardiac Drugs I:  -Group instruction provided by verbal instruction and written materials to support subject.  Instructor reviews cardiac drug classes: antiplatelets, anticoagulants, beta blockers, and statins.  Instructor discusses reasons, side effects, and lifestyle considerations for each drug class.   Cardiac Drugs II:  -Group instruction provided by verbal instruction and written materials to support subject.  Instructor reviews cardiac drug classes: angiotensin converting enzyme inhibitors (ACE-I), angiotensin II receptor blockers (ARBs), nitrates, and calcium channel blockers.  Instructor discusses reasons, side effects, and lifestyle considerations for each drug class.   Anatomy and Physiology of the Circulatory System:  -Group instruction provided by verbal instruction, video, and written materials to support subject.  Reviews functional anatomy of heart, how it relates to various diagnoses, and what role the heart plays in the overall system.   Knowledge Questionnaire Score:     Knowledge Questionnaire Score - 10/31/15 1602      Knowledge Questionnaire Score   Pre Score 18/24      Core Components/Risk Factors/Patient Goals at Admission:     Personal Goals and Risk Factors at Admission - 10/31/15 1608      Core Components/Risk Factors/Patient Goals on Admission   Sedentary Yes   Intervention Provide advice, education, support and counseling about physical activity/exercise needs.;Develop an individualized exercise prescription for aerobic and resistive training based on  initial evaluation findings, risk stratification, comorbidities and participant's personal goals.   Expected Outcomes Achievement of increased cardiorespiratory fitness and enhanced flexibility, muscular endurance and strength shown through measurements of functional capacity and personal statement of participant.   Increase Strength and  Stamina Yes   Intervention Provide advice, education, support and counseling about physical activity/exercise needs.;Develop an individualized exercise prescription for aerobic and resistive training based on initial evaluation findings, risk stratification, comorbidities and participant's personal goals.   Expected Outcomes Achievement of increased cardiorespiratory fitness and enhanced flexibility, muscular endurance and strength shown through measurements of functional capacity and personal statement of participant.   Personal Goal Other Yes   Personal Goal Increase exercise tolerance. be able to perform ADL's. Return to work.   Intervention Develop individualized aerobic and resistive training to improve cardiorespiratory fitness, improve strength and stamina, and increase functional capacity to achieve goals.   Expected Outcomes Achievement of increased functional capacity and MET level to perform ADL's and work activities.      Core Components/Risk Factors/Patient Goals Review:      Goals and Risk Factor Review    Row Name 11/22/15 1726             Core Components/Risk Factors/Patient Goals Review   Personal Goals Review Other;Increase Strength and Stamina;Improve shortness of breath with ADL's       Review Pt is interested in lifting weights order to sent to cardiologist for clearance. Pt will initiate home exercise program tomorrow       Expected Outcomes Pt will continue to home exercise program with less symptoms of fatigue and SOB          Core Components/Risk Factors/Patient Goals at Discharge (Final Review):      Goals and Risk Factor  Review - 11/22/15 1726      Core Components/Risk Factors/Patient Goals Review   Personal Goals Review Other;Increase Strength and Stamina;Improve shortness of breath with ADL's   Review Pt is interested in lifting weights order to sent to cardiologist for clearance. Pt will initiate home exercise program tomorrow   Expected Outcomes Pt will continue to home exercise program with less symptoms of fatigue and SOB      ITP Comments:     ITP Comments    Row Name 10/31/15 1324 11/17/15 1545         ITP Comments Medical Director- Dr. Fransico Him, MD attended Hypertension video/lecture education offering/met outcome, goals         Comments: Pt is making expected progress toward personal goals after completing 10 sessions. Recommend continued exercise and life style modification education including  stress management and relaxation techniques to decrease cardiac risk profile.

## 2015-12-04 ENCOUNTER — Encounter (HOSPITAL_COMMUNITY)
Admission: RE | Admit: 2015-12-04 | Discharge: 2015-12-04 | Disposition: A | Discharge status: Home / Self Care | Payer: Medicaid Other | Source: Ambulatory Visit | Attending: Cardiology | Admitting: Cardiology

## 2015-12-04 DIAGNOSIS — Z952 Presence of prosthetic heart valve: Secondary | ICD-10-CM

## 2015-12-06 ENCOUNTER — Encounter (HOSPITAL_COMMUNITY)
Admission: RE | Admit: 2015-12-06 | Discharge: 2015-12-06 | Disposition: A | Discharge status: Home / Self Care | Payer: Self-pay | Source: Ambulatory Visit | Attending: Cardiology | Admitting: Cardiology

## 2015-12-06 DIAGNOSIS — Z952 Presence of prosthetic heart valve: Secondary | ICD-10-CM

## 2015-12-06 NOTE — Progress Notes (Signed)
Pt arrived at cardiac rehab c/o back pain that occurred spontaneously last yesterday. Pt reports he was seen today by chiropractor with minimal relief.  Exercise routine adjusted to alleviate discomfort. Pt tolerated exercise without difficulty.

## 2015-12-08 ENCOUNTER — Encounter (HOSPITAL_COMMUNITY)
Admission: RE | Admit: 2015-12-08 | Discharge: 2015-12-08 | Disposition: A | Discharge status: Home / Self Care | Payer: Medicaid Other | Source: Ambulatory Visit | Attending: Cardiology | Admitting: Cardiology

## 2015-12-08 DIAGNOSIS — Z952 Presence of prosthetic heart valve: Secondary | ICD-10-CM

## 2015-12-11 ENCOUNTER — Encounter (HOSPITAL_COMMUNITY)
Admission: RE | Admit: 2015-12-11 | Discharge: 2015-12-11 | Disposition: A | Discharge status: Home / Self Care | Payer: Medicaid Other | Source: Ambulatory Visit | Attending: Cardiology | Admitting: Cardiology

## 2015-12-11 DIAGNOSIS — Z952 Presence of prosthetic heart valve: Secondary | ICD-10-CM

## 2015-12-13 ENCOUNTER — Encounter (HOSPITAL_COMMUNITY)
Admission: RE | Admit: 2015-12-13 | Discharge: 2015-12-13 | Disposition: A | Discharge status: Home / Self Care | Payer: Medicaid Other | Source: Ambulatory Visit | Attending: Cardiology | Admitting: Cardiology

## 2015-12-13 DIAGNOSIS — Z952 Presence of prosthetic heart valve: Secondary | ICD-10-CM

## 2015-12-15 ENCOUNTER — Encounter (HOSPITAL_COMMUNITY)
Admission: RE | Admit: 2015-12-15 | Discharge: 2015-12-15 | Disposition: A | Discharge status: Home / Self Care | Payer: Medicaid Other | Source: Ambulatory Visit | Attending: Cardiology | Admitting: Cardiology

## 2015-12-15 DIAGNOSIS — Z952 Presence of prosthetic heart valve: Secondary | ICD-10-CM

## 2015-12-15 DIAGNOSIS — Z954 Presence of other heart-valve replacement: Secondary | ICD-10-CM | POA: Insufficient documentation

## 2015-12-20 ENCOUNTER — Encounter (HOSPITAL_COMMUNITY)
Admission: RE | Admit: 2015-12-20 | Discharge: 2015-12-20 | Disposition: A | Discharge status: Home / Self Care | Payer: Self-pay | Source: Ambulatory Visit | Attending: Cardiology | Admitting: Cardiology

## 2015-12-20 DIAGNOSIS — Z952 Presence of prosthetic heart valve: Secondary | ICD-10-CM

## 2015-12-22 ENCOUNTER — Encounter (HOSPITAL_COMMUNITY)
Admission: RE | Admit: 2015-12-22 | Discharge: 2015-12-22 | Disposition: A | Discharge status: Home / Self Care | Payer: Self-pay | Source: Ambulatory Visit | Attending: Cardiology | Admitting: Cardiology

## 2015-12-22 DIAGNOSIS — Z952 Presence of prosthetic heart valve: Secondary | ICD-10-CM

## 2015-12-22 NOTE — Progress Notes (Signed)
Daily Session Note  Patient Details  Name: Andre Baker MRN: [id number] Date of Birth: 21-Sep-1990 Referring Provider:   Flowsheet Row CARDIAC REHAB PHASE II ORIENTATION from 10/31/2015 in [place]  Referring Provider  Jeralyn Bennett, MD      Encounter Date: 12/22/2015  Check In:     Session Check In - 12/22/15 1409      Check-In   Location [facility]   Staff Present Dorna Bloom, MS, ACSM RCEP, Exercise Physiologist;Other;Tenleigh Byer, RN, Deland Pretty, MS, ACSM CEP, Exercise Physiologist   Supervising physician immediately available to respond to emergencies [facility] immediately available   Physician(s) Dr. Candiss Norse   Medication changes reported     No   Fall or balance concerns reported    No   Warm-up and Cool-down Performed as group-led instruction   Resistance Training Performed Yes   VAD Patient? No     VAD patient   Has back up controller? No     Pain Assessment   Currently in Pain? No/denies   Multiple Pain Sites No      Capillary Blood Glucose: No results found for this or any previous visit (from the past 24 hour(s)).   Goals Met:  Independence with exercise equipment  Goals Unmet:  Not Applicable  Comments: pt c/o generalized fatigue unimproved with exercise. Pt reports he awakens daily feeling tired and unrested.  Pt otherwise asymptomatic. Pt reports he has had unremarkable sleep study in the past. Pt also reports he does not feel stimulated during the day with depressive symptoms as a result, however denies clinical depression symptoms. Pt advised to increase fruits and vegetables in daily diet for balance, try to incorporate sunlight in daily routine and continue aerobic exercise as prescribed. If symptoms persist or worsen discuss with PCP.  Understanding verbalized.   Dr. Fransico Him is Medical Director for Cardiac Rehab at [facility].

## 2015-12-25 ENCOUNTER — Encounter (HOSPITAL_COMMUNITY)
Admission: RE | Admit: 2015-12-25 | Discharge: 2015-12-25 | Disposition: A | Discharge status: Home / Self Care | Payer: Medicaid Other | Source: Ambulatory Visit | Attending: Cardiology | Admitting: Cardiology

## 2015-12-25 DIAGNOSIS — Z952 Presence of prosthetic heart valve: Secondary | ICD-10-CM

## 2015-12-27 ENCOUNTER — Encounter (HOSPITAL_COMMUNITY)
Admission: RE | Admit: 2015-12-27 | Discharge: 2015-12-27 | Disposition: A | Discharge status: Home / Self Care | Payer: Medicaid Other | Source: Ambulatory Visit | Attending: Cardiology | Admitting: Cardiology

## 2015-12-27 DIAGNOSIS — Z952 Presence of prosthetic heart valve: Secondary | ICD-10-CM

## 2015-12-29 ENCOUNTER — Encounter (HOSPITAL_COMMUNITY)
Admission: RE | Admit: 2015-12-29 | Discharge: 2015-12-29 | Disposition: A | Discharge status: Home / Self Care | Payer: Medicaid Other | Source: Ambulatory Visit | Attending: Cardiology | Admitting: Cardiology

## 2015-12-29 DIAGNOSIS — Z952 Presence of prosthetic heart valve: Secondary | ICD-10-CM

## 2015-12-29 NOTE — Progress Notes (Signed)
Cardiac Individual Treatment Plan  Patient Details  Name: Andre Baker MRN: [id number] Date of Birth: 1990-05-19 Referring Provider:   Flowsheet Row CARDIAC REHAB PHASE II ORIENTATION from 10/31/2015 in [place]  Referring Provider  Jeralyn Bennett, MD      Initial Encounter Date:  Flowsheet Row CARDIAC REHAB PHASE II ORIENTATION from 10/31/2015 in [place]  Date  10/31/15  Referring Provider  Jeralyn Bennett, MD      Visit Diagnosis: S/P pulmonary valve replacement  Patient's Home Medications on Admission:  Current Outpatient Prescriptions:  .  aspirin 81 MG tablet, Take 81 mg by mouth daily., Disp: , Rfl:  .  bismuth subsalicylate (PEPTO BISMOL) 262 MG chewable tablet, Chew 262 mg by mouth as needed., Disp: , Rfl:  .  calcium carbonate (TUMS - DOSED IN MG ELEMENTAL CALCIUM) 500 MG chewable tablet, Chew 1 tablet by mouth 3 (three) times daily as needed. , Disp: , Rfl:  .  co-enzyme Q-10 30 MG capsule, Take 30 mg by mouth daily., Disp: , Rfl:  .  diphenhydrAMINE (SOMINEX) 25 MG tablet, Take 25 mg by mouth 2 (two) times daily as needed for allergies. , Disp: , Rfl:  .  fexofenadine (ALLEGRA) 180 MG tablet, Take 180 mg by mouth daily., Disp: , Rfl:  .  Guaifenesin (MUCINEX MAXIMUM STRENGTH) 1200 MG TB12, Take 1 tablet by mouth 2 (two) times daily as needed. , Disp: , Rfl:  .  loperamide (IMODIUM) 2 MG capsule, Take 2 mg by mouth as needed for diarrhea or loose stools., Disp: , Rfl:  .  mometasone-formoterol (DULERA) 100-5 MCG/ACT AERO, 2 pffs x 15 min before exercise (Patient not taking: Reported on 10/31/2015), Disp: 1 Inhaler, Rfl: 1 .  Multiple Vitamin (MULTIVITAMIN) capsule, Take 1 capsule by mouth daily., Disp: , Rfl:  .  NONFORMULARY OR COMPOUNDED ITEM, Place 1 drop under the tongue 3 (three) times daily. Allergy immunotherapy, Disp: , Rfl:  .  NONFORMULARY OR COMPOUNDED ITEM, Take 1 tablet by mouth daily.  "Ultra Hepa Trope II Dietary Supplement" produced by [employer]. Includes taurine + NAC, Disp: , Rfl:  .  NONFORMULARY OR COMPOUNDED ITEM, Take 1 tablet by mouth daily. "Inflamed" produced by [employer]. Includes tumeric., Disp: , Rfl:  .  Nutritional Supplements (RA MELATONIN/B-6 PO), Take 1 tablet by mouth at bedtime. , Disp: , Rfl:  .  omeprazole (PRILOSEC) 20 MG capsule, Take 20 mg by mouth daily.  , Disp: , Rfl:  .  PARoxetine (PAXIL) 10 MG tablet, Take 10 mg by mouth every morning. Total daily dose = 72m, Disp: , Rfl:  .  paroxetine (PAXIL) 10 MG/5ML suspension, Take 1 mg by mouth every morning. Total daily dose = 127m Disp: , Rfl:  .  saccharomyces boulardii (FLORASTOR) 250 MG capsule, Take 250 mg by mouth daily., Disp: , Rfl:  .  TraZODone & Diet Manage Prod (TRAZAMINE PO), Take 37.5 mg by mouth daily., Disp: , Rfl:   Past Medical History: Past Medical History:  Diagnosis Date  . Allergic rhinitis   . Anxiety   . 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 Chronic headaches   . Depression   . Fatigue   . GERD (gastroesophageal reflux disease)   . IBS (irritable bowel syndrome)   . OCD (obsessive compulsive disorder)   . PTSD (post-traumatic stress disorder)   . Sleep apnea   . 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 Never Smoker  Smokeless Tobacco  . Not on file    Labs: Recent Review Flowsheet Data    There is no flowsheet data to display.      Capillary Blood Glucose: No results found for: GLUCAP   Exercise Target Goals:    Exercise Program Goal: Individual exercise prescription set with THRR, safety & activity barriers. Participant demonstrates ability to understand and report RPE using BORG scale, to self-measure pulse accurately, and to acknowledge the importance of the exercise prescription.  Exercise Prescription Goal: Starting with aerobic activity 30 plus minutes a day, 3 days per week for initial  exercise prescription. Provide home exercise prescription and guidelines that participant acknowledges understanding prior to discharge.  Activity Barriers & Risk Stratification:     Activity Barriers & Cardiac Risk Stratification - 10/31/15 1355      Activity Barriers & Cardiac Risk Stratification   Activity Barriers None   Cardiac Risk Stratification Low      6 Minute Walk:     6 Minute Walk    Row Name 10/31/15 1603         6 Minute Walk   Phase Initial     Distance 2182 feet     Walk Time 6 minutes     # of Rest Breaks 0     MPH 4.13     METS 7.53     RPE 14     VO2 Peak 26.35     Symptoms No     Resting HR 88 bpm     Resting BP 122/60     Max Ex. HR 146 bpm     Max Ex. BP 144/80     2 Minute Post BP 129/68        Initial Exercise Prescription:     Initial Exercise Prescription - 10/31/15 1600      Date of Initial Exercise RX and Referring Provider   Date 10/31/15   Referring Provider Jeralyn Bennett, MD     Treadmill   MPH 2.5   Grade 2   Minutes 10   METs 3.6     Bike   Level 1.2   Minutes 10   METs 3.6     NuStep   Level 4   Minutes 10   METs 3     Prescription Details   Frequency (times per week) 3   Duration Progress to 30 minutes of continuous aerobic without signs/symptoms of physical distress     Intensity   THRR 40-80% of Max Heartrate 79-158   Ratings of Perceived Exertion 11-13   Perceived Dyspnea 0-4     Progression   Progression Continue to progress workloads to maintain intensity without signs/symptoms of physical distress.     Resistance Training   Training Prescription Yes   Weight 3 lbs   Reps 10-12      Perform Capillary Blood Glucose checks as needed.  Exercise Prescription Changes:     Exercise Prescription Changes    Row Name 11/10/15 1500 11/22/15 1700 12/07/15 1300 12/25/15 1600       Exercise Review   Progression Yes Yes Yes Yes      Response to Exercise   Blood Pressure (Admit) 120/86 120/82  118/72 104/72    Blood Pressure (Exercise) 136/80 144/72 140/70 142/70    Blood Pressure (Exit) 118/80 100/58 110/50 100/60    Heart Rate (Admit) 95 bpm 105 bpm 99 bpm  92 bpm    Heart Rate (Exercise) 121 bpm 167 bpm 160 bpm 171 bpm    Heart Rate (Exit) 87 bpm 102 bpm 99 bpm 92 bpm    Rating of Perceived Exertion (Exercise) '14 13 13 16    '$ Duration Progress to 30 minutes of continuous aerobic without signs/symptoms of physical distress Progress to 30 minutes of continuous aerobic without signs/symptoms of physical distress Progress to 30 minutes of continuous aerobic without signs/symptoms of physical distress Progress to 30 minutes of continuous aerobic without signs/symptoms of physical distress    Intensity THRR unchanged THRR unchanged THRR unchanged THRR unchanged      Progression   Progression Continue to progress workloads to maintain intensity without signs/symptoms of physical distress. Continue to progress workloads to maintain intensity without signs/symptoms of physical distress. Continue to progress workloads to maintain intensity without signs/symptoms of physical distress. Continue to progress workloads to maintain intensity without signs/symptoms of physical distress.    Average METs 3.6 3.9 4.5 6.8      Resistance Training   Training Prescription Yes Yes Yes Yes    Weight 5lbs 5lbs 6lbs 10 lbs    Reps 10-12 10-12 10-12 10-12      Treadmill   MPH 2.5 2.8 2.8 -    Grade '2 2 2 '$ -    Minutes '10 10 10 '$ -    METs 3.6 3.91 3.91 -      Bike   Level 1.7 1.7 1.7 -    Minutes '10 10 10 '$ -    METs 4.73 4.68 4.68 -      NuStep   Level '4 5 5 5    '$ Minutes '10 10 10 10    '$ METs 3.7 3.8 4.7 6.3      Elliptical   Level  -  -  - 1    Speed  -  -  - 1    Minutes  -  -  - 10    METs  -  -  - 7.3      Rower   Level  -  -  - 2    Watts  -  -  - 98    Minutes  -  -  - 10    METs  -  -  - 7      Home Exercise Plan   Plans to continue exercise at  -  - Home  Reviewed HEP on 11/15/15 see  progress note Home  Reviewed HEP on 11/15/15 see progress note    Frequency  -  - Add 3 additional days to program exercise sessions. Add 3 additional days to program exercise sessions.       Exercise Comments:     Exercise Comments    Row Name 11/15/15 1444 11/24/15 1539 12/07/15 1307 12/25/15 1626     Exercise Comments Reviewed HEP with pt and [facility]Thurs./Sat. Reviewed METs and goals, Pt is tolerating exercise well; will continue to monitor exercise progression Reviewed METs. Pt is tolerating exercise well; will continue to monitor exercise progression Reviewed METs and goals. Pt is tolerating exercise well; will continue to monitor exercise progression       Discharge Exercise Prescription (Final Exercise Prescription Changes):     Exercise Prescription Changes - 12/25/15 1600      Exercise Review   Progression Yes     Response to Exercise   Blood Pressure (Admit) 104/72   Blood Pressure (  Exercise) 142/70   Blood Pressure (Exit) 100/60   Heart Rate (Admit) 92 bpm   Heart Rate (Exercise) 171 bpm   Heart Rate (Exit) 92 bpm   Rating of Perceived Exertion (Exercise) 16   Duration Progress to 30 minutes of continuous aerobic without signs/symptoms of physical distress   Intensity THRR unchanged     Progression   Progression Continue to progress workloads to maintain intensity without signs/symptoms of physical distress.   Average METs 6.8     Resistance Training   Training Prescription Yes   Weight 10 lbs   Reps 10-12     Treadmill   MPH --   Grade --   Minutes --   METs --     Bike   Level --   Minutes --   METs --     NuStep   Level 5   Minutes 10   METs 6.3     Elliptical   Level 1   Speed 1   Minutes 10   METs 7.3     Rower   Level 2   Watts 98   Minutes 10   METs 7     Home Exercise Plan   Plans to continue exercise at [place] on 11/15/15 see progress note   Frequency Add 3 additional days to  program exercise sessions.      Nutrition:  Target Goals: Understanding of nutrition guidelines, daily intake of sodium '1500mg'$ , cholesterol '200mg'$ , calories 30% from fat and 7% or less from saturated fats, daily to have 5 or more servings of fruits and vegetables.  Biometrics:     Pre Biometrics - 10/31/15 1555      Pre Biometrics   Height 5' 8.25" (1.734 m)   Weight 191 lb 5.8 oz (86.8 kg)   Waist Circumference 34 inches   Hip Circumference 41.25 inches   Waist to Hip Ratio 0.82 %   BMI (Calculated) 28.9   Triceps Skinfold 25 mm   % Body Fat 25.7 %   Grip Strength 48 kg   Flexibility 16.5 in   Single Leg Stand 30 seconds       Nutrition Therapy Plan and Nutrition Goals:     Nutrition Therapy & Goals - 11/09/15 1456      Nutrition Therapy   Diet General, Healthful diet     Personal Nutrition Goals   Personal Goal #1 1-2 lb wt loss/week to a goal wt loss of 6-24 lb at graduation from [place], educate and counsel regarding individualized specific dietary modifications aiming towards targeted core components such as weight, hypertension, lipid management, diabetes, heart failure and other comorbidities.   Expected Outcomes Short Term Goal: Understand basic principles of dietary content, such as calories, fat, sodium, cholesterol and nutrients.;Long Term Goal: Adherence to prescribed nutrition plan.      Nutrition Discharge: Nutrition Scores:     Nutrition Assessments - 12/01/15 1441      MEDFICTS Scores   Pre Score 76      Nutrition Goals Re-Evaluation:   Psychosocial: Target Goals: Acknowledge presence or absence of depression, maximize coping skills, provide positive support system. Participant is able to verbalize types and ability to use techniques and skills needed for reducing stress and depression.  Initial Review & Psychosocial Screening:     Initial Psych Review & Screening - 10/31/15 [facility]?  Yes     Barriers   Psychosocial barriers to participate in program There are no identifiable barriers or psychosocial needs.     Screening Interventions   Interventions Encouraged to exercise      Quality of Life Scores:     Quality of Life - 10/31/15 1603      Quality of Life Scores   Health/Function Pre 7.58 %   Socioeconomic Pre 14.5 %   Psych/Spiritual Pre 6.21 %   Family Pre 24 %   GLOBAL Pre 10.52 %      PHQ-9: Recent Review Flowsheet Data    Depression screen [facility] 2/9 11/06/2015 11/06/2015   Decreased Interest - 0   Down, Depressed, Hopeless (No Data)  1   PHQ - 2 Score - 1      Psychosocial Evaluation and Intervention:     Psychosocial Evaluation - 12/01/15 1143      Psychosocial Evaluation & Interventions   Interventions Encouraged to exercise with the program and follow exercise prescription   Comments pt demonstrates good coping skills with positive hopeful outlook.  pt is participating in group exercise program without difficulty.    Continued Psychosocial Services Needed No      Psychosocial Re-Evaluation:     Psychosocial Re-Evaluation    [place] Name 12/29/15 1645             Psychosocial Re-Evaluation   Interventions Encouraged to attend Cardiac Rehabilitation for the exercise       Comments no psychosocial needs identified, no inteventions necessary. pt exhibits increased physical ability. pt displays lively interactions with staff and peers of older ages in his group exercise setting.  pt originally felt that he had continued fatigue, however today he reports that after reflecting on it, he feels like he has much greater energy and physical ability than he has had in the last 1 year. pt is looking forward to increasing his physical ability even more. pt enjoys using the more aggressive gym equipment, ie elliptical and rowing machine.        Continued Psychosocial Services Needed No          Vocational  Rehabilitation: Provide vocational rehab assistance to qualifying candidates.   Vocational Rehab Evaluation & Intervention:     Vocational Rehab - 11/06/15 1653      Initial Vocational Rehab Evaluation & Intervention   Assessment shows need for Vocational Rehabilitation No     Discharge Vocational Rehab   Discharge Vocational Rehabilitation --  Enzo does not want vocational rehab at this time after looking at the packet.      Education: Education Goals: Education classes will be provided on a weekly basis, covering required topics. Participant will state understanding/return demonstration of topics presented.  Learning Barriers/Preferences:     Learning Barriers/Preferences - 10/31/15 1601      Learning Barriers/Preferences   Learning Barriers None   Learning Preferences Audio;Individual Instruction;Video;Verbal Instruction      Education Topics: Count Your Pulse:  -Group instruction provided by verbal instruction, demonstration, patient participation and written materials to support subject.  Instructors address importance of being able to find your pulse and how to count your pulse when at home without a heart monitor.  Patients get hands on experience counting their pulse with staff help and individually. Flowsheet Row CARDIAC REHAB PHASE II EXERCISE from 12/29/2015 in [place]  Date  12/15/15  Educator  Barnet Pall, RN  Instruction Review Code  2- meets goals/outcomes  Heart Attack, Angina, and Risk Factor Modification:  -Group instruction provided by verbal instruction, video, and written materials to support subject.  Instructors address signs and symptoms of angina and heart attacks.    Also discuss risk factors for heart disease and how to make changes to improve heart health risk factors.   Functional Fitness:  -Group instruction provided by verbal instruction, demonstration, patient participation, and written materials  to support subject.  Instructors address safety measures for doing things around the house.  Discuss how to get up and down off the floor, how to pick things up properly, how to safely get out of a chair without assistance, and balance training. Flowsheet Row CARDIAC REHAB PHASE II EXERCISE from 12/29/2015 in [place]  Date  12/29/15  Instruction Review Code  2- meets goals/outcomes      Meditation and Mindfulness:  -Group instruction provided by verbal instruction, patient participation, and written materials to support subject.  Instructor addresses importance of mindfulness and meditation practice to help reduce stress and improve awareness.  Instructor also leads participants through a meditation exercise.    Stretching for Flexibility and Mobility:  -Group instruction provided by verbal instruction, patient participation, and written materials to support subject.  Instructors lead participants through series of stretches that are designed to increase flexibility thus improving mobility.  These stretches are additional exercise for major muscle groups that are typically performed during regular warm up and cool down. Flowsheet Row CARDIAC REHAB PHASE II EXERCISE from 12/29/2015 in [place]  Date  12/08/15  Instruction Review Code  2- meets goals/outcomes      Hands Only CPR Anytime:  -Group instruction provided by verbal instruction, video, patient participation and written materials to support subject.  Instructors co-teach with [facility] video for hands only CPR.  Participants get hands on experience with mannequins.   Nutrition I class: Heart Healthy Eating:  -Group instruction provided by PowerPoint slides, verbal discussion, and written materials to support subject matter. The instructor gives an explanation and review of the Therapeutic Lifestyle Changes diet recommendations, which includes a discussion on lipid goals,  dietary fat, sodium, fiber, plant stanol/sterol esters, sugar, and the components of a well-balanced, healthy diet. Flowsheet Row CARDIAC REHAB PHASE II EXERCISE from 12/29/2015 in [place]  Date  12/01/15  Educator  RD  Instruction Review Code  Not applicable [class handouts given]      Nutrition II class: Lifestyle Skills:  -Group instruction provided by PowerPoint slides, verbal discussion, and written materials to support subject matter. The instructor gives an explanation and review of label reading, grocery shopping for heart health, heart healthy recipe modifications, and ways to make healthier choices when eating out. Flowsheet Row CARDIAC REHAB PHASE II EXERCISE from 12/29/2015 in [place]  Date  12/26/15  Educator  RD  Instruction Review Code  2- meets goals/outcomes [class handouts given]      Diabetes Question & Answer:  -Group instruction provided by PowerPoint slides, verbal discussion, and written materials to support subject matter. The instructor gives an explanation and review of diabetes co-morbidities, pre- and post-prandial blood glucose goals, pre-exercise blood glucose goals, signs, symptoms, and treatment of hypoglycemia and hyperglycemia, and foot care basics. Flowsheet Row CARDIAC REHAB PHASE II EXERCISE from 12/29/2015 in [place]  Date  12/22/15  Educator  RD  Instruction Review Code  2- meets goals/outcomes  Diabetes Blitz:  -Group instruction provided by PowerPoint slides, verbal discussion, and written materials to support subject matter. The instructor gives an explanation and review of the physiology behind type 1 and type 2 diabetes, diabetes medications and rational behind using different medications, pre- and post-prandial blood glucose recommendations and Hemoglobin A1c goals, diabetes diet, and exercise including blood glucose guidelines for  exercising safely.    Portion Distortion:  -Group instruction provided by PowerPoint slides, verbal discussion, written materials, and food models to support subject matter. The instructor gives an explanation of serving size versus portion size, changes in portions sizes over the last 20 years, and what consists of a serving from each food group. Flowsheet Row CARDIAC REHAB PHASE II EXERCISE from 12/29/2015 in [place]  Date  11/22/15  Educator  RD  Instruction Review Code  2- meets goals/outcomes      Stress Management:  -Group instruction provided by verbal instruction, video, and written materials to support subject matter.  Instructors review role of stress in heart disease and how to cope with stress positively.   Flowsheet Row CARDIAC REHAB PHASE II EXERCISE from 12/29/2015 in [place]  Date  11/15/15  Educator  Andi Hence, RN  Instruction Review Code  2- meets goals/outcomes      Exercising on Your Own:  -Group instruction provided by verbal instruction, power point, and written materials to support subject.  Instructors discuss benefits of exercise, components of exercise, frequency and intensity of exercise, and end points for exercise.  Also discuss use of nitroglycerin and activating EMS.  Review options of places to exercise outside of rehab.  Review guidelines for sex with heart disease. Flowsheet Row CARDIAC REHAB PHASE II EXERCISE from 12/29/2015 in [place]  Date  11/08/15  Educator  Seward Carol  Instruction Review Code  2- meets goals/outcomes      Cardiac Drugs I:  -Group instruction provided by verbal instruction and written materials to support subject.  Instructor reviews cardiac drug classes: antiplatelets, anticoagulants, beta blockers, and statins.  Instructor discusses reasons, side effects, and lifestyle considerations for each drug class. Flowsheet Row  CARDIAC REHAB PHASE II EXERCISE from 12/29/2015 in [place]  Date  11/29/15  Educator  pharmD  Instruction Review Code  2- meets goals/outcomes      Cardiac Drugs II:  -Group instruction provided by verbal instruction and written materials to support subject.  Instructor reviews cardiac drug classes: angiotensin converting enzyme inhibitors (ACE-I), angiotensin II receptor blockers (ARBs), nitrates, and calcium channel blockers.  Instructor discusses reasons, side effects, and lifestyle considerations for each drug class. Flowsheet Row CARDIAC REHAB PHASE II EXERCISE from 12/29/2015 in [place]  Date  12/27/15  Educator  pharm d  Instruction Review Code  2- meets goals/outcomes      Anatomy and Physiology of the Circulatory System:  -Group instruction provided by verbal instruction, video, and written materials to support subject.  Reviews functional anatomy of heart, how it relates to various diagnoses, and what role the heart plays in the overall system. Flowsheet Row CARDIAC REHAB PHASE II EXERCISE from 12/29/2015 in [place]  Date  12/20/15  Instruction Review Code  2- meets goals/outcomes      Knowledge Questionnaire Score:     Knowledge Questionnaire Score - 10/31/15 1602      Knowledge Questionnaire Score   Pre Score  18/24      Core Components/Risk Factors/Patient Goals at Admission:     Personal Goals and Risk Factors at Admission - 10/31/15 1608      Core Components/Risk Factors/Patient Goals on Admission   Sedentary Yes   Intervention Provide advice, education, support and counseling about physical activity/exercise needs.;Develop an individualized exercise prescription for aerobic and resistive training based on initial evaluation findings, risk stratification, comorbidities and participant's personal goals.   Expected Outcomes Achievement of increased  cardiorespiratory fitness and enhanced flexibility, muscular endurance and strength shown through measurements of functional capacity and personal statement of participant.   Increase Strength and Stamina Yes   Intervention Provide advice, education, support and counseling about physical activity/exercise needs.;Develop an individualized exercise prescription for aerobic and resistive training based on initial evaluation findings, risk stratification, comorbidities and participant's personal goals.   Expected Outcomes Achievement of increased cardiorespiratory fitness and enhanced flexibility, muscular endurance and strength shown through measurements of functional capacity and personal statement of participant.   Personal Goal Other Yes   Personal Goal Increase exercise tolerance. be able to perform ADL's. Return to work.   Intervention Develop individualized aerobic and resistive training to improve cardiorespiratory fitness, improve strength and stamina, and increase functional capacity to achieve goals.   Expected Outcomes Achievement of increased functional capacity and MET level to perform ADL's and work activities.      Core Components/Risk Factors/Patient Goals Review:      Goals and Risk Factor Review    Row Name 11/22/15 1726 12/25/15 1626           Core Components/Risk Factors/Patient Goals Review   Personal Goals Review Other;Increase Strength and Stamina;Improve shortness of breath with ADL's Other;Increase Strength and Stamina      Review Pt is interested in lifting weights order to sent to cardiologist for clearance. Pt will initiate home exercise program tomorrow Pt is struggling with SOB first thing in the morning . Still feel tired and fatigue throughout the day. Pt has noticed an increase in cardiovascular fitness.      Expected Outcomes Pt will continue to home exercise program with less symptoms of fatigue and SOB Pt will continue to improve in cardiovascular fitness and  decrease symptoms of SOB/fatigue         Core Components/Risk Factors/Patient Goals at Discharge (Final Review):      Goals and Risk Factor Review - 12/25/15 1626      Core Components/Risk Factors/Patient Goals Review   Personal Goals Review Other;Increase Strength and Stamina   Review Pt is struggling with SOB first thing in the morning . Still feel tired and fatigue throughout the day. Pt has noticed an increase in cardiovascular fitness.   Expected Outcomes Pt will continue to improve in cardiovascular fitness and decrease symptoms of SOB/fatigue      ITP Comments:     ITP Comments    Row Name 10/31/15 1324 11/17/15 1545         ITP Comments Medical Director- Dr. Fransico Him, MD attended Hypertension video/lecture education offering/met outcome, goals         Comments: Pt is making expected progress toward personal goals after completing 21 sessions. Recommend continued exercise and life style modification education including  stress management and relaxation techniques to decrease cardiac risk profile.

## 2016-01-01 ENCOUNTER — Encounter (HOSPITAL_COMMUNITY)
Admission: RE | Admit: 2016-01-01 | Discharge: 2016-01-01 | Disposition: A | Discharge status: Home / Self Care | Payer: Medicaid Other | Source: Ambulatory Visit | Attending: Cardiology | Admitting: Cardiology

## 2016-01-01 DIAGNOSIS — Z952 Presence of prosthetic heart valve: Secondary | ICD-10-CM

## 2016-01-03 ENCOUNTER — Encounter (HOSPITAL_COMMUNITY)
Admission: RE | Admit: 2016-01-03 | Discharge: 2016-01-03 | Disposition: A | Discharge status: Home / Self Care | Payer: Medicaid Other | Source: Ambulatory Visit | Attending: Cardiology | Admitting: Cardiology

## 2016-01-03 DIAGNOSIS — Z952 Presence of prosthetic heart valve: Secondary | ICD-10-CM

## 2016-01-05 ENCOUNTER — Encounter (HOSPITAL_COMMUNITY)
Admission: RE | Admit: 2016-01-05 | Discharge: 2016-01-05 | Disposition: A | Discharge status: Home / Self Care | Payer: Medicaid Other | Source: Ambulatory Visit | Attending: Cardiology | Admitting: Cardiology

## 2016-01-05 DIAGNOSIS — Z952 Presence of prosthetic heart valve: Secondary | ICD-10-CM

## 2016-01-08 ENCOUNTER — Encounter (HOSPITAL_COMMUNITY)
Admission: RE | Admit: 2016-01-08 | Discharge: 2016-01-08 | Disposition: A | Discharge status: Home / Self Care | Payer: Medicaid Other | Source: Ambulatory Visit | Attending: Cardiology | Admitting: Cardiology

## 2016-01-08 DIAGNOSIS — Z952 Presence of prosthetic heart valve: Secondary | ICD-10-CM

## 2016-01-10 ENCOUNTER — Encounter (HOSPITAL_COMMUNITY): Payer: Medicaid Other

## 2016-01-10 ENCOUNTER — Telehealth (HOSPITAL_COMMUNITY): Payer: Self-pay | Admitting: Family Medicine

## 2016-01-12 ENCOUNTER — Encounter (HOSPITAL_COMMUNITY)
Admission: RE | Admit: 2016-01-12 | Discharge: 2016-01-12 | Disposition: A | Discharge status: Home / Self Care | Payer: Medicaid Other | Source: Ambulatory Visit | Attending: Cardiology | Admitting: Cardiology

## 2016-01-12 DIAGNOSIS — Z952 Presence of prosthetic heart valve: Secondary | ICD-10-CM

## 2016-01-15 ENCOUNTER — Encounter (HOSPITAL_COMMUNITY)
Admission: RE | Admit: 2016-01-15 | Discharge: 2016-01-15 | Disposition: A | Discharge status: Home / Self Care | Payer: Medicaid Other | Source: Ambulatory Visit | Attending: Cardiology | Admitting: Cardiology

## 2016-01-15 DIAGNOSIS — Z952 Presence of prosthetic heart valve: Secondary | ICD-10-CM

## 2016-01-15 DIAGNOSIS — Z954 Presence of other heart-valve replacement: Secondary | ICD-10-CM | POA: Insufficient documentation

## 2016-01-17 ENCOUNTER — Encounter (HOSPITAL_COMMUNITY)
Admission: RE | Admit: 2016-01-17 | Discharge: 2016-01-17 | Disposition: A | Discharge status: Home / Self Care | Payer: Medicaid Other | Source: Ambulatory Visit | Attending: Cardiology | Admitting: Cardiology

## 2016-01-17 DIAGNOSIS — Z952 Presence of prosthetic heart valve: Secondary | ICD-10-CM

## 2016-01-19 ENCOUNTER — Encounter (HOSPITAL_COMMUNITY)
Admission: RE | Admit: 2016-01-19 | Discharge: 2016-01-19 | Disposition: A | Discharge status: Home / Self Care | Payer: Medicaid Other | Source: Ambulatory Visit | Attending: Cardiology | Admitting: Cardiology

## 2016-01-19 DIAGNOSIS — Z952 Presence of prosthetic heart valve: Secondary | ICD-10-CM

## 2016-01-22 ENCOUNTER — Encounter (HOSPITAL_COMMUNITY)
Admission: RE | Admit: 2016-01-22 | Discharge: 2016-01-22 | Disposition: A | Discharge status: Home / Self Care | Payer: Medicaid Other | Source: Ambulatory Visit | Attending: Cardiology | Admitting: Cardiology

## 2016-01-22 DIAGNOSIS — Z952 Presence of prosthetic heart valve: Secondary | ICD-10-CM

## 2016-01-24 ENCOUNTER — Encounter (HOSPITAL_COMMUNITY)
Admission: RE | Admit: 2016-01-24 | Discharge: 2016-01-24 | Disposition: A | Discharge status: Home / Self Care | Payer: Medicaid Other | Source: Ambulatory Visit | Attending: Cardiology | Admitting: Cardiology

## 2016-01-24 DIAGNOSIS — Z952 Presence of prosthetic heart valve: Secondary | ICD-10-CM

## 2016-01-24 NOTE — Progress Notes (Signed)
Cardiac Individual Treatment Plan  Patient Details  Name: Andre Baker MRN: [id number] Date of Birth: 1990-05-19 Referring Provider:   Flowsheet Row CARDIAC REHAB PHASE II ORIENTATION from 10/31/2015 in [place] [facility]  Referring Provider  Jeralyn Bennett, MD      Initial Encounter Date:  Flowsheet Row CARDIAC REHAB PHASE II ORIENTATION from 10/31/2015 in [facility]  Date  10/31/15  Referring Provider  Jeralyn Bennett, MD      Visit Diagnosis: S/P pulmonary valve replacement  Patient's Home Medications on Admission:  Current Outpatient Prescriptions:  .  aspirin 81 MG tablet, Take 81 mg by mouth daily., Disp: , Rfl:  .  bismuth subsalicylate (PEPTO BISMOL) 262 MG chewable tablet, Chew 262 mg by mouth as needed., Disp: , Rfl:  .  calcium carbonate (TUMS - DOSED IN MG ELEMENTAL CALCIUM) 500 MG chewable tablet, Chew 1 tablet by mouth 3 (three) times daily as needed. , Disp: , Rfl:  .  co-enzyme Q-10 30 MG capsule, Take 30 mg by mouth daily., Disp: , Rfl:  .  diphenhydrAMINE (SOMINEX) 25 MG tablet, Take 25 mg by mouth 2 (two) times daily as needed for allergies. , Disp: , Rfl:  .  fexofenadine (ALLEGRA) 180 MG tablet, Take 180 mg by mouth daily., Disp: , Rfl:  .  Guaifenesin (MUCINEX MAXIMUM STRENGTH) 1200 MG TB12, Take 1 tablet by mouth 2 (two) times daily as needed. , Disp: , Rfl:  .  loperamide (IMODIUM) 2 MG capsule, Take 2 mg by mouth as needed for diarrhea or loose stools., Disp: , Rfl:  .  mometasone-formoterol (DULERA) 100-5 MCG/ACT AERO, 2 pffs x 15 min before exercise (Patient not taking: Reported on 10/31/2015), Disp: 1 Inhaler, Rfl: 1 .  Multiple Vitamin (MULTIVITAMIN) capsule, Take 1 capsule by mouth daily., Disp: , Rfl:  .  NONFORMULARY OR COMPOUNDED ITEM, Place 1 drop under the tongue 3 (three) times daily. Allergy immunotherapy, Disp: , Rfl:  .  NONFORMULARY OR COMPOUNDED ITEM, Take 1 tablet by mouth daily.  "Ultra Hepa Trope II Dietary Supplement" produced by [employer]. Includes taurine + NAC, Disp: , Rfl:  .  NONFORMULARY OR COMPOUNDED ITEM, Take 1 tablet by mouth daily. "Inflamed" produced by [employer]. Includes tumeric., Disp: , Rfl:  .  Nutritional Supplements (RA MELATONIN/B-6 PO), Take 1 tablet by mouth at bedtime. , Disp: , Rfl:  .  omeprazole (PRILOSEC) 20 MG capsule, Take 20 mg by mouth daily.  , Disp: , Rfl:  .  PARoxetine (PAXIL) 10 MG tablet, Take 10 mg by mouth every morning. Total daily dose = 72m, Disp: , Rfl:  .  paroxetine (PAXIL) 10 MG/5ML suspension, Take 1 mg by mouth every morning. Total daily dose = 127m Disp: , Rfl:  .  saccharomyces boulardii (FLORASTOR) 250 MG capsule, Take 250 mg by mouth daily., Disp: , Rfl:  .  TraZODone & Diet Manage Prod (TRAZAMINE PO), Take 37.5 mg by mouth daily., Disp: , Rfl:   Past Medical History: Past Medical History:  Diagnosis Date  . Allergic rhinitis   . Anxiety   . 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 Chronic headaches   . Depression   . Fatigue   . GERD (gastroesophageal reflux disease)   . IBS (irritable bowel syndrome)   . OCD (obsessive compulsive disorder)   . PTSD (post-traumatic stress disorder)   . Sleep apnea   . 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 Never Smoker  Smokeless Tobacco  . Not on file    Labs: Recent Review Flowsheet Data    There is no flowsheet data to display.      Capillary Blood Glucose: No results found for: GLUCAP   Exercise Target Goals:    Exercise Program Goal: Individual exercise prescription set with THRR, safety & activity barriers. Participant demonstrates ability to understand and report RPE using BORG scale, to self-measure pulse accurately, and to acknowledge the importance of the exercise prescription.  Exercise Prescription Goal: Starting with aerobic activity 30 plus minutes a day, 3 days per week for initial  exercise prescription. Provide home exercise prescription and guidelines that participant acknowledges understanding prior to discharge.  Activity Barriers & Risk Stratification:     Activity Barriers & Cardiac Risk Stratification - 10/31/15 1355      Activity Barriers & Cardiac Risk Stratification   Activity Barriers None   Cardiac Risk Stratification Low      6 Minute Walk:     6 Minute Walk    Row Name 10/31/15 1603         6 Minute Walk   Phase Initial     Distance 2182 feet     Walk Time 6 minutes     # of Rest Breaks 0     MPH 4.13     METS 7.53     RPE 14     VO2 Peak 26.35     Symptoms No     Resting HR 88 bpm     Resting BP 122/60     Max Ex. HR 146 bpm     Max Ex. BP 144/80     2 Minute Post BP 129/68        Initial Exercise Prescription:     Initial Exercise Prescription - 10/31/15 1600      Date of Initial Exercise RX and Referring Provider   Date 10/31/15   Referring Provider Jeralyn Bennett, MD     Treadmill   MPH 2.5   Grade 2   Minutes 10   METs 3.6     Bike   Level 1.2   Minutes 10   METs 3.6     NuStep   Level 4   Minutes 10   METs 3     Prescription Details   Frequency (times per week) 3   Duration Progress to 30 minutes of continuous aerobic without signs/symptoms of physical distress     Intensity   THRR 40-80% of Max Heartrate 79-158   Ratings of Perceived Exertion 11-13   Perceived Dyspnea 0-4     Progression   Progression Continue to progress workloads to maintain intensity without signs/symptoms of physical distress.     Resistance Training   Training Prescription Yes   Weight 3 lbs   Reps 10-12      Perform Capillary Blood Glucose checks as needed.  Exercise Prescription Changes:      Exercise Prescription Changes    Row Name 11/10/15 1500 11/22/15 1700 12/07/15 1300 12/25/15 1600 01/22/16 1700     Exercise Review   Progression Yes Yes Yes Yes Yes     Response to Exercise   Blood Pressure  (Admit) 120/86 120/82 118/72 104/72 116/70   Blood Pressure (Exercise) 136/80 144/72 140/70 142/70 130/80   Blood Pressure (Exit) 118/80 100/58 110/50 100/60 110/70   Heart Rate (Admit) 95 bpm 105 bpm 99  bpm 92 bpm 98 bpm   Heart Rate (Exercise) 121 bpm 167 bpm 160 bpm 171 bpm 173 bpm   Heart Rate (Exit) 87 bpm 102 bpm 99 bpm 92 bpm 98 bpm   Rating of Perceived Exertion (Exercise) '14 13 13 16 15   '$ Duration Progress to 30 minutes of continuous aerobic without signs/symptoms of physical distress Progress to 30 minutes of continuous aerobic without signs/symptoms of physical distress Progress to 30 minutes of continuous aerobic without signs/symptoms of physical distress Progress to 30 minutes of continuous aerobic without signs/symptoms of physical distress Progress to 30 minutes of continuous aerobic without signs/symptoms of physical distress   Intensity THRR unchanged THRR unchanged THRR unchanged THRR unchanged THRR unchanged     Progression   Progression Continue to progress workloads to maintain intensity without signs/symptoms of physical distress. Continue to progress workloads to maintain intensity without signs/symptoms of physical distress. Continue to progress workloads to maintain intensity without signs/symptoms of physical distress. Continue to progress workloads to maintain intensity without signs/symptoms of physical distress. Continue to progress workloads to maintain intensity without signs/symptoms of physical distress.   Average METs 3.6 3.9 4.5 6.8 6.8     Resistance Training   Training Prescription Yes Yes Yes Yes Yes   Weight 5lbs 5lbs 6lbs 10 lbs 10 lbs   Reps 10-12 10-12 10-12 10-12 10-12     Treadmill   MPH 2.5 2.8 2.8 -  -   Grade '2 2 2 '$ -  -   Minutes '10 10 10 '$ -  -   METs 3.6 3.91 3.91 -  -     Bike   Level 1.7 1.7 1.7 -  -   Minutes '10 10 10 '$ -  -   METs 4.73 4.68 4.68 -  -     NuStep   Level '4 5 5 5 5   '$ Minutes '10 10 10 10 10   '$ METs 3.7 3.8 4.7 6.3 5.9      Elliptical   Level  -  -  - 1 2   Speed  -  -  - 1 1   Minutes  -  -  - 10 10   METs  -  -  - 7.3 7.5     Rower   Level  -  -  - 2 2   Watts  -  -  - 98 90   Minutes  -  -  - 10 10   METs  -  -  - 7 7     Home Exercise Plan   Plans to continue exercise at  -  - Home  Reviewed HEP on 11/15/15 see progress note Home  Reviewed HEP on 11/15/15 see progress note Home  Reviewed HEP on 11/15/15 see progress note   Frequency  -  - Add 3 additional days to program exercise sessions. Add 3 additional days to program exercise sessions. Add 3 additional days to program exercise sessions.      Exercise Comments:      Exercise Comments    Row Name 11/15/15 1444 11/24/15 1539 12/07/15 1307 12/25/15 1626 01/22/16 1712   Exercise Comments Reviewed HEP with pt and [facility] T[facility]Thurs./Sat. Reviewed METs and goals, Pt is tolerating exercise well; will continue to monitor exercise progression Reviewed METs. Pt is tolerating exercise well; will continue to monitor exercise progression Reviewed METs and goals. Pt is tolerating exercise well; will continue to monitor exercise progression  Reviewed METs and goals. Pt is tolerating exercise well; will continue to monitor exercise progression   Row Name 01/24/16 1718           Exercise Comments THR increase to 177. Pt was oriented to resistant training on pulley sysytem. Pt tolerated weigghts well. BP and HR were stabled.           Discharge Exercise Prescription (Final Exercise Prescription Changes):     Exercise Prescription Changes - 01/22/16 1700      Exercise Review   Progression Yes     Response to Exercise   Blood Pressure (Admit) 116/70   Blood Pressure (Exercise) 130/80   Blood Pressure (Exit) 110/70   Heart Rate (Admit) 98 bpm   Heart Rate (Exercise) 173 bpm   Heart Rate (Exit) 98 bpm   Rating of Perceived Exertion (Exercise) 15   Duration Progress to 30 minutes of continuous aerobic without  signs/symptoms of physical distress   Intensity THRR unchanged     Progression   Progression Continue to progress workloads to maintain intensity without signs/symptoms of physical distress.   Average METs 6.8     Resistance Training   Training Prescription Yes   Weight 10 lbs   Reps 10-12     NuStep   Level 5   Minutes 10   METs 5.9     Elliptical   Level 2   Speed 1   Minutes 10   METs 7.5     Rower   Level 2   Watts 90   Minutes 10   METs 7     Home Exercise Plan   Plans to continue exercise at [facility] on 11/15/15 see progress note   Frequency Add 3 additional days to program exercise sessions.      Nutrition:  Target Goals: Understanding of nutrition guidelines, daily intake of sodium '1500mg'$ , cholesterol '200mg'$ , calories 30% from fat and 7% or less from saturated fats, daily to have 5 or more servings of fruits and vegetables.  Biometrics:     Pre Biometrics - 10/31/15 1555      Pre Biometrics   Height 5' 8.25" (1.734 m)   Weight 191 lb 5.8 oz (86.8 kg)   Waist Circumference 34 inches   Hip Circumference 41.25 inches   Waist to Hip Ratio 0.82 %   BMI (Calculated) 28.9   Triceps Skinfold 25 mm   % Body Fat 25.7 %   Grip Strength 48 kg   Flexibility 16.5 in   Single Leg Stand 30 seconds       Nutrition Therapy Plan and Nutrition Goals:     Nutrition Therapy & Goals - 11/09/15 1456      Nutrition Therapy   Diet General, Healthful diet     Personal Nutrition Goals   Personal Goal #1 1-2 lb wt loss/week to a goal wt loss of 6-24 lb at graduation from [place], educate and counsel regarding individualized specific dietary modifications aiming towards targeted core components such as weight, hypertension, lipid management, diabetes, heart failure and other comorbidities.   Expected Outcomes Short Term Goal: Understand basic principles of dietary content, such as calories, fat, sodium,  cholesterol and nutrients.;Long Term Goal: Adherence to prescribed nutrition plan.      Nutrition Discharge: Nutrition Scores:     Nutrition Assessments - 12/01/15 1441      MEDFICTS Scores   Pre Score 76  Nutrition Goals Re-Evaluation:   Psychosocial: Target Goals: Acknowledge presence or absence of depression, maximize coping skills, provide positive support system. Participant is able to verbalize types and ability to use techniques and skills needed for reducing stress and depression.  Initial Review & Psychosocial Screening:     Initial Psych Review & Screening - 10/31/15 [place]? Yes     Barriers   Psychosocial barriers to participate in program There are no identifiable barriers or psychosocial needs.     Screening Interventions   Interventions Encouraged to exercise      Quality of Life Scores:     Quality of Life - 10/31/15 1603      Quality of Life Scores   Health/Function Pre 7.58 %   Socioeconomic Pre 14.5 %   Psych/Spiritual Pre 6.21 %   Family Pre 24 %   GLOBAL Pre 10.52 %      PHQ-9: Recent Review Flowsheet Data    Depression screen [facility] 2/9 11/06/2015 11/06/2015   Decreased Interest - 0   Down, Depressed, Hopeless (No Data)  1   PHQ - 2 Score - 1      Psychosocial Evaluation and Intervention:     Psychosocial Evaluation - 12/01/15 1143      Psychosocial Evaluation & Interventions   Interventions Encouraged to exercise with the program and follow exercise prescription   Comments pt demonstrates good coping skills with positive hopeful outlook.  pt is participating in group exercise program without difficulty.    Continued Psychosocial Services Needed No      Psychosocial Re-Evaluation:     Psychosocial Re-Evaluation    [place] Name 12/29/15 1645 01/24/16 1705           Psychosocial Re-Evaluation   Interventions Encouraged to attend Cardiac Rehabilitation for the exercise Encouraged to  attend Cardiac Rehabilitation for the exercise      Comments no psychosocial needs identified, no inteventions necessary. pt exhibits increased physical ability. pt displays lively interactions with staff and peers of older ages in his group exercise setting.  pt originally felt that he had continued fatigue, however today he reports that after reflecting on it, he feels like he has much greater energy and physical ability than he has had in the last 1 year. pt is looking forward to increasing his physical ability even more. pt enjoys using the more aggressive gym equipment, ie elliptical and rowing machine.  no psychosocial needs identified, no intervention necessary.  pt reports he has increased energy, stamina and increased exercise tolerance. pt has greatly increased interactions with peers and staff in his group exercise setting. pt demonstrates comfort and confidence in exercise routine.       Continued Psychosocial Services Needed No No  pt history of depression is effectively managed by his PCP.          Vocational Rehabilitation: Provide vocational rehab assistance to qualifying candidates.   Vocational Rehab Evaluation & Intervention:     Vocational Rehab - 11/06/15 1653      Initial Vocational Rehab Evaluation & Intervention   Assessment shows need for Vocational Rehabilitation No     Discharge Vocational Rehab   Discharge Vocational Rehabilitation --  L[place]io does not want vocational rehab at this time after looking at the packet.      Education: Education Goals: Education classes will be provided on a weekly basis, covering required topics. Participant will state understanding/return demonstration of topics presented.  Learning Barriers/Preferences:     Learning Barriers/Preferences - 10/31/15 1601      Learning Barriers/Preferences   Learning Barriers None   Learning Preferences Audio;Individual Instruction;Video;Verbal Instruction      Education Topics: Count Your  Pulse:  -Group instruction provided by verbal instruction, demonstration, patient participation and written materials to support subject.  Instructors address importance of being able to find your pulse and how to count your pulse when at home without a heart monitor.  Patients get hands on experience counting their pulse with staff help and individually. Flowsheet Row CARDIAC REHAB PHASE II EXERCISE from 01/24/2016 in [place]  Date  12/15/15  Educator  Barnet Pall, RN  Instruction Review Code  2- meets goals/outcomes      Heart Attack, Angina, and Risk Factor Modification:  -Group instruction provided by verbal instruction, video, and written materials to support subject.  Instructors address signs and symptoms of angina and heart attacks.    Also discuss risk factors for heart disease and how to make changes to improve heart health risk factors.   Functional Fitness:  -Group instruction provided by verbal instruction, demonstration, patient participation, and written materials to support subject.  Instructors address safety measures for doing things around the house.  Discuss how to get up and down off the floor, how to pick things up properly, how to safely get out of a chair without assistance, and balance training. Flowsheet Row CARDIAC REHAB PHASE II EXERCISE from 01/24/2016 in [place]  Date  12/29/15  Instruction Review Code  2- meets goals/outcomes      Meditation and Mindfulness:  -Group instruction provided by verbal instruction, patient participation, and written materials to support subject.  Instructor addresses importance of mindfulness and meditation practice to help reduce stress and improve awareness.  Instructor also leads participants through a meditation exercise.    Stretching for Flexibility and Mobility:  -Group instruction provided by verbal instruction, patient participation, and written  materials to support subject.  Instructors lead participants through series of stretches that are designed to increase flexibility thus improving mobility.  These stretches are additional exercise for major muscle groups that are typically performed during regular warm up and cool down. Flowsheet Row CARDIAC REHAB PHASE II EXERCISE from 01/24/2016 in [facility]  Date  12/08/15  Instruction Review Code  2- meets goals/outcomes      Hands Only CPR Anytime:  -Group instruction provided by verbal instruction, video, patient participation and written materials to support subject.  Instructors co-teach with [facility] video for hands only CPR.  Participants get hands on experience with mannequins.   Nutrition I class: Heart Healthy Eating:  -Group instruction provided by PowerPoint slides, verbal discussion, and written materials to support subject matter. The instructor gives an explanation and review of the Therapeutic Lifestyle Changes diet recommendations, which includes a discussion on lipid goals, dietary fat, sodium, fiber, plant stanol/sterol esters, sugar, and the components of a well-balanced, healthy diet. Flowsheet Row CARDIAC REHAB PHASE II EXERCISE from 01/24/2016 in [facility]  Date  12/01/15  Educator  RD  Instruction Review Code  Not applicable [class handouts given]      Nutrition II class: Lifestyle Skills:  -Group instruction provided by PowerPoint slides, verbal discussion, and written materials to support subject matter. The instructor gives an explanation and review of label reading, grocery shopping for heart health, heart healthy recipe modifications, and ways to  make healthier choices when eating out. Flowsheet Row CARDIAC REHAB PHASE II EXERCISE from 01/24/2016 in [place]  Date  12/26/15  Educator  RD  Instruction Review Code  2- meets goals/outcomes [class handouts given]       Diabetes Question & Answer:  -Group instruction provided by PowerPoint slides, verbal discussion, and written materials to support subject matter. The instructor gives an explanation and review of diabetes co-morbidities, pre- and post-prandial blood glucose goals, pre-exercise blood glucose goals, signs, symptoms, and treatment of hypoglycemia and hyperglycemia, and foot care basics. Flowsheet Row CARDIAC REHAB PHASE II EXERCISE from 01/24/2016 in [place]  Date  12/22/15  Educator  RD  Instruction Review Code  2- meets goals/outcomes      Diabetes Blitz:  -Group instruction provided by PowerPoint slides, verbal discussion, and written materials to support subject matter. The instructor gives an explanation and review of the physiology behind type 1 and type 2 diabetes, diabetes medications and rational behind using different medications, pre- and post-prandial blood glucose recommendations and Hemoglobin A1c goals, diabetes diet, and exercise including blood glucose guidelines for exercising safely.    Portion Distortion:  -Group instruction provided by PowerPoint slides, verbal discussion, written materials, and food models to support subject matter. The instructor gives an explanation of serving size versus portion size, changes in portions sizes over the last 20 years, and what consists of a serving from each food group. Flowsheet Row CARDIAC REHAB PHASE II EXERCISE from 01/24/2016 in [place]  Date  01/17/16  Educator  RD  Instruction Review Code  2- meets goals/outcomes      Stress Management:  -Group instruction provided by verbal instruction, video, and written materials to support subject matter.  Instructors review role of stress in heart disease and how to cope with stress positively.   Flowsheet Row CARDIAC REHAB PHASE II EXERCISE from 01/24/2016 in [place]  Date   11/15/15  Educator  Andi Hence, RN  Instruction Review Code  2- meets goals/outcomes      Exercising on Your Own:  -Group instruction provided by verbal instruction, power point, and written materials to support subject.  Instructors discuss benefits of exercise, components of exercise, frequency and intensity of exercise, and end points for exercise.  Also discuss use of nitroglycerin and activating EMS.  Review options of places to exercise outside of rehab.  Review guidelines for sex with heart disease. Flowsheet Row CARDIAC REHAB PHASE II EXERCISE from 01/24/2016 in [place]  Date  01/03/16  Educator  Seward Carol  Instruction Review Code  2- meets goals/outcomes      Cardiac Drugs I:  -Group instruction provided by verbal instruction and written materials to support subject.  Instructor reviews cardiac drug classes: antiplatelets, anticoagulants, beta blockers, and statins.  Instructor discusses reasons, side effects, and lifestyle considerations for each drug class. Flowsheet Row CARDIAC REHAB PHASE II EXERCISE from 01/24/2016 in [facility]  Date  01/24/16  Educator  pharmD  Instruction Review Code  2- meets goals/outcomes      Cardiac Drugs II:  -Group instruction provided by verbal instruction and written materials to support subject.  Instructor reviews cardiac drug classes: angiotensin converting enzyme inhibitors (ACE-I), angiotensin II receptor blockers (ARBs), nitrates, and calcium channel blockers.  Instructor discusses reasons, side effects, and lifestyle considerations for each drug class. [place]  PHASE II EXERCISE from 01/24/2016 in [facility]  Date  12/27/15  Educator  pharm d  Instruction Review Code  2- meets goals/outcomes      Anatomy and Physiology of the Circulatory System:  -Group instruction provided by verbal instruction, video, and  written materials to support subject.  Reviews functional anatomy of heart, how it relates to various diagnoses, and what role the heart plays in the overall system. Flowsheet Row CARDIAC REHAB PHASE II EXERCISE from 01/24/2016 in [place]  Date  12/20/15  Instruction Review Code  2- meets goals/outcomes      Knowledge Questionnaire Score:     Knowledge Questionnaire Score - 10/31/15 1602      Knowledge Questionnaire Score   Pre Score 18/24      Core Components/Risk Factors/Patient Goals at Admission:     Personal Goals and Risk Factors at Admission - 10/31/15 1608      Core Components/Risk Factors/Patient Goals on Admission   Sedentary Yes   Intervention Provide advice, education, support and counseling about physical activity/exercise needs.;Develop an individualized exercise prescription for aerobic and resistive training based on initial evaluation findings, risk stratification, comorbidities and participant's personal goals.   Expected Outcomes Achievement of increased cardiorespiratory fitness and enhanced flexibility, muscular endurance and strength shown through measurements of functional capacity and personal statement of participant.   Increase Strength and Stamina Yes   Intervention Provide advice, education, support and counseling about physical activity/exercise needs.;Develop an individualized exercise prescription for aerobic and resistive training based on initial evaluation findings, risk stratification, comorbidities and participant's personal goals.   Expected Outcomes Achievement of increased cardiorespiratory fitness and enhanced flexibility, muscular endurance and strength shown through measurements of functional capacity and personal statement of participant.   Personal Goal Other Yes   Personal Goal Increase exercise tolerance. be able to perform ADL's. Return to work.   Intervention Develop individualized aerobic and resistive  training to improve cardiorespiratory fitness, improve strength and stamina, and increase functional capacity to achieve goals.   Expected Outcomes Achievement of increased functional capacity and MET level to perform ADL's and work activities.      Core Components/Risk Factors/Patient Goals Review:      Goals and Risk Factor Review    Row Name 11/22/15 1726 12/25/15 1626 01/22/16 1712 01/24/16 1500       Core Components/Risk Factors/Patient Goals Review   Personal Goals Review Other;Increase Strength and Stamina;Improve shortness of breath with ADL's Other;Increase Strength and Stamina Increase Strength and Stamina Weight Management/Obesity;Other    Review Pt is interested in lifting weights order to sent to cardiologist for clearance. Pt will initiate home exercise program tomorrow Pt is struggling with SOB first thing in the morning . Still feel tired and fatigue throughout the day. Pt has noticed an increase in cardiovascular fitness. Pt is interested in weight training, awaiting response from Dr. Corine Shelter Recevied order for resistance training from Dr. Corine Shelter. Pt was oriented to weights and performed all strength training motion correctly.     Expected Outcomes Pt will continue to home exercise program with less symptoms of fatigue and SOB Pt will continue to improve in cardiovascular fitness and decrease symptoms of SOB/fatigue Pt will get stronger and experience less fatigue with activities and at rest. Pt will continue strength training after d/c from cardiac rehab. Discussed progression and apprpriatedness for size, reps and sets.       Core Components/Risk Factors/Patient Goals at Discharge (Final Review):  Goals and Risk Factor Review - 01/24/16 1500      Core Components/Risk Factors/Patient Goals Review   Personal Goals Review Weight Management/Obesity;Other   Review Recevied order for resistance training from Dr. Corine Shelter. Pt was oriented to weights and performed all  strength training motion correctly.    Expected Outcomes Pt will continue strength training after d/c from cardiac rehab. Discussed progression and apprpriatedness for size, reps and sets.      ITP Comments:     ITP Comments    Row Name 10/31/15 1324 11/17/15 1545 01/19/16 1441       ITP Comments Medical Director- Dr. Fransico Him, MD attended Hypertension video/lecture education offering/met outcome, goals attended Hypertension video/lecture education offering/met outcome, goals        Comments: Pt is making expected progress toward personal goals after completing 30 sessions. Recommend continued exercise and life style modification education including  stress management and relaxation techniques to decrease cardiac risk profile.

## 2016-01-26 ENCOUNTER — Encounter (HOSPITAL_COMMUNITY)
Admission: RE | Admit: 2016-01-26 | Discharge: 2016-01-26 | Disposition: A | Discharge status: Home / Self Care | Payer: Self-pay | Source: Ambulatory Visit | Attending: Cardiology | Admitting: Cardiology

## 2016-01-26 DIAGNOSIS — Z952 Presence of prosthetic heart valve: Secondary | ICD-10-CM

## 2016-01-29 ENCOUNTER — Encounter (HOSPITAL_COMMUNITY)
Admission: RE | Admit: 2016-01-29 | Discharge: 2016-01-29 | Disposition: A | Discharge status: Home / Self Care | Payer: Self-pay | Source: Ambulatory Visit | Attending: Cardiology | Admitting: Cardiology

## 2016-01-29 DIAGNOSIS — Z952 Presence of prosthetic heart valve: Secondary | ICD-10-CM

## 2016-01-31 ENCOUNTER — Encounter (HOSPITAL_COMMUNITY)
Admission: RE | Admit: 2016-01-31 | Discharge: 2016-01-31 | Disposition: A | Discharge status: Home / Self Care | Payer: Medicaid Other | Source: Ambulatory Visit | Attending: Cardiology | Admitting: Cardiology

## 2016-01-31 ENCOUNTER — Encounter (HOSPITAL_COMMUNITY): Payer: Self-pay

## 2016-01-31 VITALS — Ht 68.25 in | Wt 186.1 lb

## 2016-01-31 DIAGNOSIS — Z952 Presence of prosthetic heart valve: Secondary | ICD-10-CM

## 2016-01-31 NOTE — Progress Notes (Signed)
Cardiac Individual Treatment Plan  Patient Details  Name: Andre Baker MRN: [id number] Date of Birth: Apr 06, 1991 Referring Provider:   Flowsheet Row CARDIAC REHAB PHASE II ORIENTATION from 10/31/2015 in [place]  Referring Provider  Jeralyn Bennett, MD      Initial Encounter Date:  Flowsheet Row CARDIAC REHAB PHASE II ORIENTATION from 10/31/2015 in [place]  Date  10/31/15  Referring Provider  Jeralyn Bennett, MD      Visit Diagnosis: S/P pulmonary valve replacement  Patient's Home Medications on Admission:  Current Outpatient Prescriptions:  .  aspirin 81 MG tablet, Take 81 mg by mouth daily., Disp: , Rfl:  .  bismuth subsalicylate (PEPTO BISMOL) 262 MG chewable tablet, Chew 262 mg by mouth as needed., Disp: , Rfl:  .  calcium carbonate (TUMS - DOSED IN MG ELEMENTAL CALCIUM) 500 MG chewable tablet, Chew 1 tablet by mouth 3 (three) times daily as needed. , Disp: , Rfl:  .  diphenhydrAMINE (SOMINEX) 25 MG tablet, Take 25 mg by mouth 2 (two) times daily as needed for allergies. , Disp: , Rfl:  .  fexofenadine (ALLEGRA) 180 MG tablet, Take 180 mg by mouth daily., Disp: , Rfl:  .  Guaifenesin (MUCINEX MAXIMUM STRENGTH) 1200 MG TB12, Take 1 tablet by mouth 2 (two) times daily as needed. , Disp: , Rfl:  .  loperamide (IMODIUM) 2 MG capsule, Take 2 mg by mouth as needed for diarrhea or loose stools., Disp: , Rfl:  .  omeprazole (PRILOSEC) 20 MG capsule, Take 20 mg by mouth daily.  , Disp: , Rfl:  .  PARoxetine (PAXIL) 10 MG tablet, Take 10 mg by mouth every morning. Total daily dose = 31m, Disp: , Rfl:  .  paroxetine (PAXIL) 10 MG/5ML suspension, Take 1 mg by mouth every morning. Total daily dose = 143m Disp: , Rfl:  .  saccharomyces boulardii (FLORASTOR) 250 MG capsule, Take 250 mg by mouth daily., Disp: , Rfl:  .  TraZODone & Diet Manage Prod (TRAZAMINE PO), Take 37.5 mg by mouth daily., Disp: , Rfl:  .   co-enzyme Q-10 30 MG capsule, Take 30 mg by mouth daily., Disp: , Rfl:  .  mometasone-formoterol (DULERA) 100-5 MCG/ACT AERO, 2 pffs x 15 min before exercise (Patient not taking: Reported on 01/31/2016), Disp: 1 Inhaler, Rfl: 1 .  Multiple Vitamin (MULTIVITAMIN) capsule, Take 1 capsule by mouth daily., Disp: , Rfl:  .  NONFORMULARY OR COMPOUNDED ITEM, Place 1 drop under the tongue 3 (three) times daily. Allergy immunotherapy, Disp: , Rfl:  .  NONFORMULARY OR COMPOUNDED ITEM, Take 1 tablet by mouth daily. "Ultra Hepa Trope II Dietary Supplement" produced by [employer]. Includes taurine + NAC, Disp: , Rfl:  .  NONFORMULARY OR COMPOUNDED ITEM, Take 1 tablet by mouth daily. "Inflamed" produced by [employer]. Includes tumeric., Disp: , Rfl:  .  Nutritional Supplements (RA MELATONIN/B-6 PO), Take 1 tablet by mouth at bedtime. , Disp: , Rfl:   Past Medical History: Past Medical History:  Diagnosis Date  . Allergic rhinitis   . Anxiety   . 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 Chronic headaches   . Depression   . Fatigue   . GERD (gastroesophageal reflux disease)   . IBS (irritable bowel syndrome)   . OCD (obsessive compulsive disorder)   . PTSD (post-traumatic stress disorder)   . Sleep apnea   . 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 Never Smoker  Smokeless Tobacco  . Not on file    Labs: Recent Review Flowsheet Data    There is no flowsheet data to display.      Capillary Blood Glucose: No results found for: GLUCAP   Exercise Target Goals:    Exercise Program Goal: Individual exercise prescription set with THRR, safety & activity barriers. Participant demonstrates ability to understand and report RPE using BORG scale, to self-measure pulse accurately, and to acknowledge the importance of the exercise prescription.  Exercise Prescription Goal: Starting with aerobic activity 30 plus minutes a day, 3 days per week for  initial exercise prescription. Provide home exercise prescription and guidelines that participant acknowledges understanding prior to discharge.  Activity Barriers & Risk Stratification:     Activity Barriers & Cardiac Risk Stratification - 10/31/15 1355      Activity Barriers & Cardiac Risk Stratification   Activity Barriers None   Cardiac Risk Stratification Low      6 Minute Walk:     6 Minute Walk    Row Name 10/31/15 1603         6 Minute Walk   Phase Initial     Distance 2182 feet     Walk Time 6 minutes     # of Rest Breaks 0     MPH 4.13     METS 7.53     RPE 14     VO2 Peak 26.35     Symptoms No     Resting HR 88 bpm     Resting BP 122/60     Max Ex. HR 146 bpm     Max Ex. BP 144/80     2 Minute Post BP 129/68        Initial Exercise Prescription:     Initial Exercise Prescription - 10/31/15 1600      Date of Initial Exercise RX and Referring Provider   Date 10/31/15   Referring Provider Jeralyn Bennett, MD     Treadmill   MPH 2.5   Grade 2   Minutes 10   METs 3.6     Bike   Level 1.2   Minutes 10   METs 3.6     NuStep   Level 4   Minutes 10   METs 3     Prescription Details   Frequency (times per week) 3   Duration Progress to 30 minutes of continuous aerobic without signs/symptoms of physical distress     Intensity   THRR 40-80% of Max Heartrate 79-158   Ratings of Perceived Exertion 11-13   Perceived Dyspnea 0-4     Progression   Progression Continue to progress workloads to maintain intensity without signs/symptoms of physical distress.     Resistance Training   Training Prescription Yes   Weight 3 lbs   Reps 10-12      Perform Capillary Blood Glucose checks as needed.  Exercise Prescription Changes:     Exercise Prescription Changes    Row Name 11/10/15 1500 11/22/15 1700 12/07/15 1300 12/25/15 1600 01/22/16 1700     Exercise Review   Progression Yes Yes Yes Yes Yes     Response to Exercise   Blood Pressure  (Admit) 120/86 120/82 118/72 104/72 116/70   Blood Pressure (Exercise) 136/80 144/72 140/70 142/70 130/80   Blood Pressure (Exit) 118/80 100/58 110/50 100/60 110/70   Heart Rate (Admit) 95 bpm 105 bpm 99 bpm  92 bpm 98 bpm   Heart Rate (Exercise) 121 bpm 167 bpm 160 bpm 171 bpm 173 bpm   Heart Rate (Exit) 87 bpm 102 bpm 99 bpm 92 bpm 98 bpm   Rating of Perceived Exertion (Exercise) 14 13 13 16 15    Duration Progress to 30 minutes of continuous aerobic without signs/symptoms of physical distress Progress to 30 minutes of continuous aerobic without signs/symptoms of physical distress Progress to 30 minutes of continuous aerobic without signs/symptoms of physical distress Progress to 30 minutes of continuous aerobic without signs/symptoms of physical distress Progress to 30 minutes of continuous aerobic without signs/symptoms of physical distress   Intensity THRR unchanged THRR unchanged THRR unchanged THRR unchanged THRR unchanged     Progression   Progression Continue to progress workloads to maintain intensity without signs/symptoms of physical distress. Continue to progress workloads to maintain intensity without signs/symptoms of physical distress. Continue to progress workloads to maintain intensity without signs/symptoms of physical distress. Continue to progress workloads to maintain intensity without signs/symptoms of physical distress. Continue to progress workloads to maintain intensity without signs/symptoms of physical distress.   Average METs 3.6 3.9 4.5 6.8 6.8     Resistance Training   Training Prescription Yes Yes Yes Yes Yes   Weight 5lbs 5lbs 6lbs 10 lbs 10 lbs   Reps 10-12 10-12 10-12 10-12 10-12     Treadmill   MPH 2.5 2.8 2.8 -  -   Grade 2 2 2  -  -   Minutes 10 10 10  -  -   METs 3.6 3.91 3.91 -  -     Bike   Level 1.7 1.7 1.7 -  -   Minutes 10 10 10  -  -   METs 4.73 4.68 4.68 -  -     NuStep   Level 4 5 5 5 5    Minutes 10 10 10 10 10    METs 3.7 3.8 4.7 6.3 5.9      Elliptical   Level  -  -  - 1 2   Speed  -  -  - 1 1   Minutes  -  -  - 10 10   METs  -  -  - 7.3 7.5     Rower   Level  -  -  - 2 2   Watts  -  -  - 98 90   Minutes  -  -  - 10 10   METs  -  -  - 7 7     Home Exercise Plan   Plans to continue exercise at  -  - Home  Reviewed HEP on 11/15/15 see progress note Home  Reviewed HEP on 11/15/15 see progress note Home  Reviewed HEP on 11/15/15 see progress note   Frequency  -  - Add 3 additional days to program exercise sessions. Add 3 additional days to program exercise sessions. Add 3 additional days to program exercise sessions.      Exercise Comments:     Exercise Comments    Row Name 11/15/15 1444 11/24/15 1539 12/07/15 1307 12/25/15 1626 01/22/16 1712   Exercise Comments Reviewed HEP with pt and [facility]Thurs./Sat. Reviewed METs and goals, Pt is tolerating exercise well; will continue to monitor exercise progression Reviewed METs. Pt is tolerating exercise well; will continue to monitor exercise progression Reviewed METs and goals. Pt is tolerating exercise well; will continue to monitor exercise progression Reviewed METs  and goals. Pt is tolerating exercise well; will continue to monitor exercise progression   Row Name 01/24/16 1718           Exercise Comments THR increase to 177. Pt was oriented to resistant training on pulley sysytem. Pt tolerated weigghts well. BP and HR were stabled.           Discharge Exercise Prescription (Final Exercise Prescription Changes):     Exercise Prescription Changes - 01/22/16 1700      Exercise Review   Progression Yes     Response to Exercise   Blood Pressure (Admit) 116/70   Blood Pressure (Exercise) 130/80   Blood Pressure (Exit) 110/70   Heart Rate (Admit) 98 bpm   Heart Rate (Exercise) 173 bpm   Heart Rate (Exit) 98 bpm   Rating of Perceived Exertion (Exercise) 15   Duration Progress to 30 minutes of continuous aerobic without  signs/symptoms of physical distress   Intensity THRR unchanged     Progression   Progression Continue to progress workloads to maintain intensity without signs/symptoms of physical distress.   Average METs 6.8     Resistance Training   Training Prescription Yes   Weight 10 lbs   Reps 10-12     NuStep   Level 5   Minutes 10   METs 5.9     Elliptical   Level 2   Speed 1   Minutes 10   METs 7.5     Rower   Level 2   Watts 90   Minutes 10   METs 7     Home Exercise Plan   Plans to continue exercise at [facility] on 11/15/15 see progress note   Frequency Add 3 additional days to program exercise sessions.      Nutrition:  Target Goals: Understanding of nutrition guidelines, daily intake of sodium <1581m, cholesterol <203m calories 30% from fat and 7% or less from saturated fats, daily to have 5 or more servings of fruits and vegetables.  Biometrics:     Pre Biometrics - 10/31/15 1555      Pre Biometrics   Height 5' 8.25" (1.734 m)   Weight 191 lb 5.8 oz (86.8 kg)   Waist Circumference 34 inches   Hip Circumference 41.25 inches   Waist to Hip Ratio 0.82 %   BMI (Calculated) 28.9   Triceps Skinfold 25 mm   % Body Fat 25.7 %   Grip Strength 48 kg   Flexibility 16.5 in   Single Leg Stand 30 seconds         Post Biometrics - 01/29/16 1206       Post  Biometrics   Weight (P)  196 lb 13.9 oz (89.3 kg)   Waist Circumference (P)  34.75 inches   Hip Circumference (P)  43.5 inches   Waist to Hip Ratio (P)  0.8 %   Triceps Skinfold (P)  21 mm   % Body Fat (P)  25.6 %   Grip Strength (P)  46 kg   Flexibility (P)  20.25 in   Single Leg Stand (P)  30 seconds      Nutrition Therapy Plan and Nutrition Goals:     Nutrition Therapy & Goals - 11/09/15 1456      Nutrition Therapy   Diet General, Healthful diet     Personal Nutrition Goals   Personal Goal #1 1-2 lb wt loss/week to a goal wt loss of 6-24 lb at graduation from Cardiac Rehab  Intervention Plan   Intervention Prescribe, educate and counsel regarding individualized specific dietary modifications aiming towards targeted core components such as weight, hypertension, lipid management, diabetes, heart failure and other comorbidities.   Expected Outcomes Short Term Goal: Understand basic principles of dietary content, such as calories, fat, sodium, cholesterol and nutrients.;Long Term Goal: Adherence to prescribed nutrition plan.      Nutrition Discharge: Nutrition Scores:     Nutrition Assessments - 12/01/15 1441      MEDFICTS Scores   Pre Score 76      Nutrition Goals Re-Evaluation:   Psychosocial: Target Goals: Acknowledge presence or absence of depression, maximize coping skills, provide positive support system. Participant is able to verbalize types and ability to use techniques and skills needed for reducing stress and depression.  Initial Review & Psychosocial Screening:     Initial Psych Review & Screening - 10/31/15 [place]? Yes     Barriers   Psychosocial barriers to participate in program There are no identifiable barriers or psychosocial needs.     Screening Interventions   Interventions Encouraged to exercise      Quality of Life Scores:     Quality of Life - 01/29/16 1159      Quality of Life Scores   Health/Function Pre 7.58 %   Health/Function Post 22.58 %   Health/Function % Change 197.89 %   Socioeconomic Pre 14.5 %   Socioeconomic Post 24.5 %   Socioeconomic % Change  68.97 %   Psych/Spiritual Pre 6.21 %   Psych/Spiritual Post 26 %   Psych/Spiritual % Change 318.68 %   Family Pre 24 %   Family Post 26 %   Family % Change 8.33 %   GLOBAL Pre 10.52 %   GLOBAL Post 23.19 %   GLOBAL % Change 120.44 %      PHQ-9: Recent Review Flowsheet Data    Depression screen [facility] 2/9 01/31/2016 11/06/2015 11/06/2015   Decreased Interest 1  - 0   Down, Depressed, Hopeless 0 (No Data)  1   PHQ  - 2 Score 1 - 1      Psychosocial Evaluation and Intervention:     Psychosocial Evaluation - 12/01/15 1143      Psychosocial Evaluation & Interventions   Interventions Encouraged to exercise with the program and follow exercise prescription   Comments pt demonstrates good coping skills with positive hopeful outlook.  pt is participating in group exercise program without difficulty.    Continued Psychosocial Services Needed No      Psychosocial Re-Evaluation:     Psychosocial Re-Evaluation    [place] Name 12/29/15 1645 01/24/16 1705 01/31/16 1629         Psychosocial Re-Evaluation   Interventions Encouraged to attend Cardiac Rehabilitation for the exercise Encouraged to attend Cardiac Rehabilitation for the exercise  -     Comments no psychosocial needs identified, no inteventions necessary. pt exhibits increased physical ability. pt displays lively interactions with staff and peers of older ages in his group exercise setting.  pt originally felt that he had continued fatigue, however today he reports that after reflecting on it, he feels like he has much greater energy and physical ability than he has had in the last 1 year. pt is looking forward to increasing his physical ability even more. pt enjoys using the more aggressive gym equipment, ie elliptical and rowing machine.  no psychosocial needs identified, no intervention necessary.  pt  reports he has increased energy, stamina and increased exercise tolerance. pt has greatly increased interactions with peers and staff in his group exercise setting. pt demonstrates comfort and confidence in exercise routine.  no psychosocial needs identified, no intervention necessary. pt reports he has increased energy, strength and stamina.  pt is very pleased with his personoal progress at cardiac rehab and should be commended for his success.       Continued Psychosocial Services Needed No No  pt history of depression is effectively managed by his PCP.   No        Vocational Rehabilitation: Provide vocational rehab assistance to qualifying candidates.   Vocational Rehab Evaluation & Intervention:     Vocational Rehab - 11/06/15 1653      Initial Vocational Rehab Evaluation & Intervention   Assessment shows need for Vocational Rehabilitation No     Discharge Vocational Rehab   Discharge Vocational Rehabilitation --  Norbert does not want vocational rehab at this time after looking at the packet.      Education: Education Goals: Education classes will be provided on a weekly basis, covering required topics. Participant will state understanding/return demonstration of topics presented.  Learning Barriers/Preferences:     Learning Barriers/Preferences - 10/31/15 1601      Learning Barriers/Preferences   Learning Barriers None   Learning Preferences Audio;Individual Instruction;Video;Verbal Instruction      Education Topics: Count Your Pulse:  -Group instruction provided by verbal instruction, demonstration, patient participation and written materials to support subject.  Instructors address importance of being able to find your pulse and how to count your pulse when at home without a heart monitor.  Patients get hands on experience counting their pulse with staff help and individually. Flowsheet Row CARDIAC REHAB PHASE II EXERCISE from 01/24/2016 in [place]  Date  12/15/15  Educator  Barnet Pall, RN  Instruction Review Code  2- meets goals/outcomes      Heart Attack, Angina, and Risk Factor Modification:  -Group instruction provided by verbal instruction, video, and written materials to support subject.  Instructors address signs and symptoms of angina and heart attacks.    Also discuss risk factors for heart disease and how to make changes to improve heart health risk factors.   Functional Fitness:  -Group instruction provided by verbal instruction, demonstration, patient  participation, and written materials to support subject.  Instructors address safety measures for doing things around the house.  Discuss how to get up and down off the floor, how to pick things up properly, how to safely get out of a chair without assistance, and balance training. Flowsheet Row CARDIAC REHAB PHASE II EXERCISE from 01/24/2016 in [place]  Date  12/29/15  Instruction Review Code  2- meets goals/outcomes      Meditation and Mindfulness:  -Group instruction provided by verbal instruction, patient participation, and written materials to support subject.  Instructor addresses importance of mindfulness and meditation practice to help reduce stress and improve awareness.  Instructor also leads participants through a meditation exercise.    Stretching for Flexibility and Mobility:  -Group instruction provided by verbal instruction, patient participation, and written materials to support subject.  Instructors lead participants through series of stretches that are designed to increase flexibility thus improving mobility.  These stretches are additional exercise for major muscle groups that are typically performed during regular warm up and cool down. Flowsheet Row CARDIAC REHAB PHASE II EXERCISE from 01/24/2016 in [place]  Date  12/08/15  Instruction Review Code  2- meets goals/outcomes      Hands Only CPR Anytime:  -Group instruction provided by verbal instruction, video, patient participation and written materials to support subject.  Instructors co-teach with [facility] video for hands only CPR.  Participants get hands on experience with mannequins.   Nutrition I class: Heart Healthy Eating:  -Group instruction provided by PowerPoint slides, verbal discussion, and written materials to support subject matter. The instructor gives an explanation and review of the Therapeutic Lifestyle Changes diet recommendations, which  includes a discussion on lipid goals, dietary fat, sodium, fiber, plant stanol/sterol esters, sugar, and the components of a well-balanced, healthy diet. Flowsheet Row CARDIAC REHAB PHASE II EXERCISE from 01/24/2016 in [place]  Date  12/01/15  Educator  RD  Instruction Review Code  Not applicable [class handouts given]      Nutrition II class: Lifestyle Skills:  -Group instruction provided by PowerPoint slides, verbal discussion, and written materials to support subject matter. The instructor gives an explanation and review of label reading, grocery shopping for heart health, heart healthy recipe modifications, and ways to make healthier choices when eating out. Flowsheet Row CARDIAC REHAB PHASE II EXERCISE from 01/24/2016 in [place]  Date  12/26/15  Educator  RD  Instruction Review Code  2- meets goals/outcomes [class handouts given]      Diabetes Question & Answer:  -Group instruction provided by PowerPoint slides, verbal discussion, and written materials to support subject matter. The instructor gives an explanation and review of diabetes co-morbidities, pre- and post-prandial blood glucose goals, pre-exercise blood glucose goals, signs, symptoms, and treatment of hypoglycemia and hyperglycemia, and foot care basics. Flowsheet Row CARDIAC REHAB PHASE II EXERCISE from 01/24/2016 in [place]  Date  12/22/15  Educator  RD  Instruction Review Code  2- meets goals/outcomes      Diabetes Blitz:  -Group instruction provided by PowerPoint slides, verbal discussion, and written materials to support subject matter. The instructor gives an explanation and review of the physiology behind type 1 and type 2 diabetes, diabetes medications and rational behind using different medications, pre- and post-prandial blood glucose recommendations and Hemoglobin A1c goals, diabetes diet, and exercise  including blood glucose guidelines for exercising safely.    Portion Distortion:  -Group instruction provided by PowerPoint slides, verbal discussion, written materials, and food models to support subject matter. The instructor gives an explanation of serving size versus portion size, changes in portions sizes over the last 20 years, and what consists of a serving from each food group. Flowsheet Row CARDIAC REHAB PHASE II EXERCISE from 01/24/2016 in [place]  Date  01/17/16  Educator  RD  Instruction Review Code  2- meets goals/outcomes      Stress Management:  -Group instruction provided by verbal instruction, video, and written materials to support subject matter.  Instructors review role of stress in heart disease and how to cope with stress positively.   Flowsheet Row CARDIAC REHAB PHASE II EXERCISE from 01/24/2016 in [place]  Date  11/15/15  Educator  Andi Hence, RN  Instruction Review Code  2- meets goals/outcomes      Exercising on Your Own:  -Group instruction provided by verbal instruction, power point, and written materials to support subject.  Instructors discuss benefits of exercise, components of exercise, frequency and intensity of exercise,  and end points for exercise.  Also discuss use of nitroglycerin and activating EMS.  Review options of places to exercise outside of rehab.  Review guidelines for sex with heart disease. Flowsheet Row CARDIAC REHAB PHASE II EXERCISE from 01/24/2016 in [place]  Date  01/03/16  Educator  Seward Carol  Instruction Review Code  2- meets goals/outcomes      Cardiac Drugs I:  -Group instruction provided by verbal instruction and written materials to support subject.  Instructor reviews cardiac drug classes: antiplatelets, anticoagulants, beta blockers, and statins.  Instructor discusses reasons, side effects, and lifestyle  considerations for each drug class. Flowsheet Row CARDIAC REHAB PHASE II EXERCISE from 01/24/2016 in [place]  Date  01/24/16  Educator  pharmD  Instruction Review Code  2- meets goals/outcomes      Cardiac Drugs II:  -Group instruction provided by verbal instruction and written materials to support subject.  Instructor reviews cardiac drug classes: angiotensin converting enzyme inhibitors (ACE-I), angiotensin II receptor blockers (ARBs), nitrates, and calcium channel blockers.  Instructor discusses reasons, side effects, and lifestyle considerations for each drug class. Flowsheet Row CARDIAC REHAB PHASE II EXERCISE from 01/24/2016 in [place]  Date  12/27/15  Educator  pharm d  Instruction Review Code  2- meets goals/outcomes      Anatomy and Physiology of the Circulatory System:  -Group instruction provided by verbal instruction, video, and written materials to support subject.  Reviews functional anatomy of heart, how it relates to various diagnoses, and what role the heart plays in the overall system. Flowsheet Row CARDIAC REHAB PHASE II EXERCISE from 01/24/2016 in [place]  Date  12/20/15  Instruction Review Code  2- meets goals/outcomes      Knowledge Questionnaire Score:     Knowledge Questionnaire Score - 01/29/16 1159      Knowledge Questionnaire Score   Post Score 24/24      Core Components/Risk Factors/Patient Goals at Admission:     Personal Goals and Risk Factors at Admission - 10/31/15 1608      Core Components/Risk Factors/Patient Goals on Admission   Sedentary Yes   Intervention Provide advice, education, support and counseling about physical activity/exercise needs.;Develop an individualized exercise prescription for aerobic and resistive training based on initial evaluation findings, risk stratification, comorbidities and participant's personal goals.    Expected Outcomes Achievement of increased cardiorespiratory fitness and enhanced flexibility, muscular endurance and strength shown through measurements of functional capacity and personal statement of participant.   Increase Strength and Stamina Yes   Intervention Provide advice, education, support and counseling about physical activity/exercise needs.;Develop an individualized exercise prescription for aerobic and resistive training based on initial evaluation findings, risk stratification, comorbidities and participant's personal goals.   Expected Outcomes Achievement of increased cardiorespiratory fitness and enhanced flexibility, muscular endurance and strength shown through measurements of functional capacity and personal statement of participant.   Personal Goal Other Yes   Personal Goal Increase exercise tolerance. be able to perform ADL's. Return to work.   Intervention Develop individualized aerobic and resistive training to improve cardiorespiratory fitness, improve strength and stamina, and increase functional capacity to achieve goals.   Expected Outcomes Achievement of increased functional capacity and MET level to perform ADL's and work activities.      Core Components/Risk Factors/Patient Goals Review:      Goals and Risk Factor Review    Row Name 11/22/15 1726 12/25/15  1626 01/22/16 1712 01/24/16 1500       Core Components/Risk Factors/Patient Goals Review   Personal Goals Review Other;Increase Strength and Stamina;Improve shortness of breath with ADL's Other;Increase Strength and Stamina Increase Strength and Stamina Weight Management/Obesity;Other    Review Pt is interested in lifting weights order to sent to cardiologist for clearance. Pt will initiate home exercise program tomorrow Pt is struggling with SOB first thing in the morning . Still feel tired and fatigue throughout the day. Pt has noticed an increase in cardiovascular fitness. Pt is interested in weight training,  awaiting response from Dr. Corine Shelter Recevied order for resistance training from Dr. Corine Shelter. Pt was oriented to weights and performed all strength training motion correctly.     Expected Outcomes Pt will continue to home exercise program with less symptoms of fatigue and SOB Pt will continue to improve in cardiovascular fitness and decrease symptoms of SOB/fatigue Pt will get stronger and experience less fatigue with activities and at rest. Pt will continue strength training after d/c from cardiac rehab. Discussed progression and apprpriatedness for size, reps and sets.       Core Components/Risk Factors/Patient Goals at Discharge (Final Review):      Goals and Risk Factor Review - 01/24/16 1500      Core Components/Risk Factors/Patient Goals Review   Personal Goals Review Weight Management/Obesity;Other   Review Recevied order for resistance training from Dr. Corine Shelter. Pt was oriented to weights and performed all strength training motion correctly.    Expected Outcomes Pt will continue strength training after d/c from cardiac rehab. Discussed progression and apprpriatedness for size, reps and sets.      ITP Comments:     ITP Comments    Row Name 10/31/15 1324 11/17/15 1545 01/19/16 1441       ITP Comments Medical Director- Dr. Fransico Him, MD attended Hypertension video/lecture education offering/met outcome, goals attended Hypertension video/lecture education offering/met outcome, goals        Comments: Pt graduated from cardiac rehab program today with completion of 36 exercise sessions in Phase II. Pt maintained good attendance and progressed nicely during his participation in rehab as evidenced by increased MET level.   Medication list reconciled. Repeat  PHQ score- 1,which pt reports he has significant decrease in his usual symptoms.   Pt has made significant lifestyle changes and should be commended for his success. Pt feels he has achieved his goals during cardiac rehab, which  include increased strength/stamina and fitness level.     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

## 2016-02-02 ENCOUNTER — Encounter (HOSPITAL_COMMUNITY): Payer: Medicaid Other

## 2016-02-05 ENCOUNTER — Encounter (HOSPITAL_COMMUNITY): Payer: Medicaid Other

## 2016-02-07 ENCOUNTER — Encounter (HOSPITAL_COMMUNITY): Payer: Medicaid Other

## 2016-02-09 ENCOUNTER — Encounter (HOSPITAL_COMMUNITY): Payer: Medicaid Other

## 2016-02-12 ENCOUNTER — Encounter (HOSPITAL_COMMUNITY): Payer: Medicaid Other

## 2016-02-14 ENCOUNTER — Encounter (HOSPITAL_COMMUNITY): Payer: Medicaid Other

## 2016-02-16 ENCOUNTER — Encounter (HOSPITAL_COMMUNITY): Payer: Medicaid Other

## 2016-02-19 ENCOUNTER — Encounter (HOSPITAL_COMMUNITY): Payer: Medicaid Other

## 2016-02-21 ENCOUNTER — Encounter (HOSPITAL_COMMUNITY): Payer: Medicaid Other

## 2016-02-23 ENCOUNTER — Encounter (HOSPITAL_COMMUNITY): Payer: Medicaid Other

## 2016-02-26 ENCOUNTER — Encounter (HOSPITAL_COMMUNITY): Payer: Medicaid Other

## 2016-02-26 NOTE — Addendum Note (Signed)
Encounter addended by: Janele Lague D Jerold Yoss on: 02/26/2016 11:14 AM<BR>    Actions taken: Flowsheet accepted, Visit Navigator Flowsheet section accepted

## 2016-02-26 NOTE — Addendum Note (Signed)
Encounter addended by: Grason Brailsford D Kimo Bancroft on: 02/26/2016  3:25 PM<BR>    Actions taken: Visit Navigator Flowsheet section accepted

## 2016-02-28 ENCOUNTER — Encounter (HOSPITAL_COMMUNITY): Payer: Medicaid Other

## 2016-03-01 ENCOUNTER — Encounter (HOSPITAL_COMMUNITY): Payer: Medicaid Other

## 2016-03-04 ENCOUNTER — Encounter (HOSPITAL_COMMUNITY): Payer: Medicaid Other

## 2016-03-04 NOTE — Addendum Note (Signed)
Encounter addended by: Jacques EarthlyEdna Brewbaker Amiee Wiley, RD on: 03/04/2016 11:00 AM<BR>    Actions taken: Flowsheet data copied forward, Visit Navigator Flowsheet section accepted

## 2017-11-09 DIAGNOSIS — G9332 Myalgic encephalomyelitis/chronic fatigue syndrome: Secondary | ICD-10-CM | POA: Insufficient documentation

## 2021-12-19 ENCOUNTER — Encounter (HOSPITAL_BASED_OUTPATIENT_CLINIC_OR_DEPARTMENT_OTHER): Payer: Self-pay | Admitting: Emergency Medicine

## 2021-12-19 ENCOUNTER — Other Ambulatory Visit: Payer: Self-pay

## 2021-12-19 ENCOUNTER — Emergency Department (HOSPITAL_BASED_OUTPATIENT_CLINIC_OR_DEPARTMENT_OTHER)
Admission: EM | Admit: 2021-12-19 | Discharge: 2021-12-19 | Disposition: A | Discharge status: Home / Self Care | Payer: Self-pay | Attending: Emergency Medicine | Admitting: Emergency Medicine

## 2021-12-19 ENCOUNTER — Emergency Department (HOSPITAL_BASED_OUTPATIENT_CLINIC_OR_DEPARTMENT_OTHER): Payer: Self-pay

## 2021-12-19 DIAGNOSIS — Z20822 Contact with and (suspected) exposure to covid-19: Secondary | ICD-10-CM | POA: Insufficient documentation

## 2021-12-19 DIAGNOSIS — Z7982 Long term (current) use of aspirin: Secondary | ICD-10-CM | POA: Insufficient documentation

## 2021-12-19 DIAGNOSIS — R112 Nausea with vomiting, unspecified: Secondary | ICD-10-CM | POA: Insufficient documentation

## 2021-12-19 DIAGNOSIS — M25512 Pain in left shoulder: Secondary | ICD-10-CM | POA: Insufficient documentation

## 2021-12-19 DIAGNOSIS — M546 Pain in thoracic spine: Secondary | ICD-10-CM | POA: Insufficient documentation

## 2021-12-19 LAB — CBC WITH DIFFERENTIAL/PLATELET
Abs Immature Granulocytes: 0.03 10*3/uL (ref 0.00–0.07)
Basophils Absolute: 0.1 10*3/uL (ref 0.0–0.1)
Basophils Relative: 1 %
Eosinophils Absolute: 0.1 10*3/uL (ref 0.0–0.5)
Eosinophils Relative: 1 %
HCT: 39.2 % (ref 39.0–52.0)
Hemoglobin: 13.3 g/dL (ref 13.0–17.0)
Immature Granulocytes: 0 %
Lymphocytes Relative: 21 %
Lymphs Abs: 1.8 10*3/uL (ref 0.7–4.0)
MCH: 29 pg (ref 26.0–34.0)
MCHC: 33.9 g/dL (ref 30.0–36.0)
MCV: 85.6 fL (ref 80.0–100.0)
Monocytes Absolute: 0.8 10*3/uL (ref 0.1–1.0)
Monocytes Relative: 10 %
Neutro Abs: 5.5 10*3/uL (ref 1.7–7.7)
Neutrophils Relative %: 67 %
Platelets: 210 10*3/uL (ref 150–400)
RBC: 4.58 MIL/uL (ref 4.22–5.81)
RDW: 12.7 % (ref 11.5–15.5)
WBC: 8.3 10*3/uL (ref 4.0–10.5)
nRBC: 0 % (ref 0.0–0.2)

## 2021-12-19 LAB — BASIC METABOLIC PANEL
Anion gap: 10 (ref 5–15)
BUN: 10 mg/dL (ref 6–20)
CO2: 23 mmol/L (ref 22–32)
Calcium: 9.1 mg/dL (ref 8.9–10.3)
Chloride: 105 mmol/L (ref 98–111)
Creatinine, Ser: 0.93 mg/dL (ref 0.61–1.24)
GFR, Estimated: >60 mL/min (ref >60)
Glucose, Bld: 99 mg/dL (ref 70–99)
Potassium: 3.5 mmol/L (ref 3.5–5.1)
Sodium: 138 mmol/L (ref 135–145)

## 2021-12-19 LAB — D-DIMER, QUANTITATIVE: D-Dimer, Quant: <0.27 ug/mL-FEU (ref 0.00–0.50)

## 2021-12-19 LAB — SARS CORONAVIRUS 2 BY RT PCR: SARS Coronavirus 2 by RT PCR: NEGATIVE

## 2021-12-19 LAB — TROPONIN I (HIGH SENSITIVITY): Troponin I (High Sensitivity): 7 ng/L (ref <18)

## 2021-12-19 MED ORDER — IBUPROFEN 800 MG PO TABS: 800.0000 mg | ORAL_TABLET | Freq: Four times a day (QID) | ORAL | 0 refills | Status: AC | PRN

## 2021-12-19 MED ORDER — KETOROLAC TROMETHAMINE 30 MG/ML IJ SOLN
15.0000 mg | Freq: Once | INTRAMUSCULAR | Status: AC
  Administered 2021-12-19: 15 mg via INTRAVENOUS
  Filled 2021-12-19: qty 1

## 2021-12-19 MED ORDER — ONDANSETRON HCL 4 MG/2ML IJ SOLN
4.0000 mg | Freq: Once | INTRAMUSCULAR | Status: AC
  Administered 2021-12-19: 4 mg via INTRAVENOUS
  Filled 2021-12-19: qty 2

## 2021-12-19 MED ORDER — HYDROMORPHONE HCL 1 MG/ML IJ SOLN
1.0000 mg | Freq: Once | INTRAMUSCULAR | Status: AC
  Administered 2021-12-19: 1 mg via INTRAVENOUS
  Filled 2021-12-19: qty 1

## 2021-12-19 MED ORDER — DEXAMETHASONE SODIUM PHOSPHATE 10 MG/ML IJ SOLN
10.0000 mg | Freq: Once | INTRAMUSCULAR | Status: AC
  Administered 2021-12-19: 10 mg via INTRAVENOUS
  Filled 2021-12-19: qty 1

## 2021-12-19 MED ORDER — SODIUM CHLORIDE 0.9 % IV BOLUS
1000.0000 mL | Freq: Once | INTRAVENOUS | Status: AC
  Administered 2021-12-19: 1000 mL via INTRAVENOUS

## 2021-12-19 MED ORDER — OXYCODONE-ACETAMINOPHEN 5-325 MG PO TABS: 1.0000 | ORAL_TABLET | Freq: Four times a day (QID) | ORAL | 0 refills | Status: DC | PRN

## 2021-12-19 NOTE — ED Triage Notes (Signed)
Patient arrived via POV c/o left shoulder pain x 2 days. Patient state pain radiating across left scapula and under left arm. Patient seen previously at [facility] for same. Patient states pain 8/10. Patient is ao x 4, VS WDL, normal gait.

## 2021-12-19 NOTE — ED Provider Notes (Signed)
[facility] EMERGENCY DEPARTMENT Provider Note   CSN: [id number] Arrival date & time: 12/19/21  0100     History  Chief Complaint  Patient presents with   Shoulder Pain    Andre Baker is a 31 y.o. male.  Patient presents to the emergency department for evaluation of left shoulder pain.  Patient has been experiencing progressively worsening, now severe left shoulder and left upper back pain for 2 days.  He was seen at urgent care yesterday, given Toradol and discharged with Robaxin.  He has not had improvement.  He has not been able to sleep.  Tonight he has developed nausea and vomiting.       Home Medications Prior to Admission medications   Medication Sig Start Date End Date Taking? Authorizing Provider  ibuprofen (ADVIL) 800 MG tablet Take 1 tablet (800 mg total) by mouth every 6 (six) hours as needed for moderate pain. 12/19/21  Yes Kimi Kroft, Canary Brim, MD  oxyCODONE-acetaminophen (PERCOCET) 5-325 MG tablet Take 1 tablet by mouth every 6 (six) hours as needed for severe pain. 12/19/21  Yes Sumayyah Custodio, Canary Brim, MD  aspirin 81 MG tablet Take 81 mg by mouth daily.    [provider]  bismuth subsalicylate (PEPTO BISMOL) 262 MG chewable tablet Chew 262 mg by mouth as needed.    [provider]  calcium carbonate (TUMS - DOSED IN MG ELEMENTAL CALCIUM) 500 MG chewable tablet Chew 1 tablet by mouth 3 (three) times daily as needed.     [provider]  co-enzyme Q-10 30 MG capsule Take 30 mg by mouth daily.    [provider]  diphenhydrAMINE (SOMINEX) 25 MG tablet Take 25 mg by mouth 2 (two) times daily as needed for allergies.     [provider]  fexofenadine (ALLEGRA) 180 MG tablet Take 180 mg by mouth daily.    [provider]  Guaifenesin (MUCINEX MAXIMUM STRENGTH) 1200 MG TB12 Take 1 tablet by mouth 2 (two) times daily as needed.     [provider]  loperamide (IMODIUM) 2 MG capsule Take 2 mg by  mouth as needed for diarrhea or loose stools.    [provider]  mometasone-formoterol (DULERA) 100-5 MCG/ACT AERO 2 pffs x 15 min before exercise Patient not taking: Reported on 01/31/2016 03/27/15   Nyoka Cowden, MD  Multiple Vitamin (MULTIVITAMIN) capsule Take 1 capsule by mouth daily.    [provider]  NONFORMULARY OR COMPOUNDED ITEM Place 1 drop under the tongue 3 (three) times daily. Allergy immunotherapy    [provider]  NONFORMULARY OR COMPOUNDED ITEM Take 1 tablet by mouth daily. "Ultra Hepa Trope II Dietary Supplement" produced by [employer]. Includes taurine + NAC    [provider]  NONFORMULARY OR COMPOUNDED ITEM Take 1 tablet by mouth daily. "Inflamed" produced by [employer]. Includes tumeric.    [provider]  Nutritional Supplements (RA MELATONIN/B-6 PO) Take 1 tablet by mouth at bedtime.     [provider]  omeprazole (PRILOSEC) 20 MG capsule Take 20 mg by mouth daily.      [provider]  PARoxetine (PAXIL) 10 MG tablet Take 10 mg by mouth every morning. Total daily dose = 11mg     [provider]  paroxetine (PAXIL) 10 MG/5ML suspension Take 1 mg by mouth every morning. Total daily dose = 11mg     [provider]  saccharomyces boulardii (FLORASTOR) 250 MG capsule Take 250 mg by mouth daily.  [provider]  TraZODone & Diet Manage Prod (TRAZAMINE PO) Take 37.5 mg by mouth daily.    [provider]      Allergies    Benzodiazepines, Codeine, Imipramine, and Zithromax [azithromycin]    Review of Systems   Review of Systems  Physical Exam Updated Vital Signs BP 113/68   Pulse 67   Temp 98.7 F (37.1 C) (Oral)   Resp 18   Ht 5\' 6"  (1.676 m)   Wt 86.2 kg   SpO2 100%   BMI 30.67 kg/m  Physical Exam Vitals and nursing note reviewed.  Constitutional:      General: He is not in acute distress.    Appearance: He is well-developed.  HENT:      Head: Normocephalic and atraumatic.     Mouth/Throat:     Mouth: Mucous membranes are moist.  Eyes:     General: Vision grossly intact. Gaze aligned appropriately.     Extraocular Movements: Extraocular movements intact.     Conjunctiva/sclera: Conjunctivae normal.  Cardiovascular:     Rate and Rhythm: Normal rate and regular rhythm.     Pulses: Normal pulses.     Heart sounds: Normal heart sounds, S1 normal and S2 normal. No murmur heard.    No friction rub. No gallop.  Pulmonary:     Effort: Pulmonary effort is normal. No respiratory distress.     Breath sounds: Normal breath sounds.  Abdominal:     Palpations: Abdomen is soft.     Tenderness: There is no abdominal tenderness. There is no guarding or rebound.     Hernia: No hernia is present.  Musculoskeletal:        General: No swelling.     Left shoulder: Tenderness present. No swelling or deformity. Normal range of motion. Normal strength.     Cervical back: Full passive range of motion without pain, normal range of motion and neck supple. No pain with movement, spinous process tenderness or muscular tenderness. Normal range of motion.     Right lower leg: No edema.     Left lower leg: No edema.     Comments: Diffuse soft tissue tenderness around the anterior, superior, posterior shoulder as well as left upper back and subscapular area.  Normal range of motion of the shoulder although there is popping with raising and rotating the shoulder  Skin:    General: Skin is warm and dry.     Capillary Refill: Capillary refill takes less than 2 seconds.     Findings: No ecchymosis, erythema, lesion or wound.  Neurological:     Mental Status: He is alert and oriented to person, place, and time.     GCS: GCS eye subscore is 4. GCS verbal subscore is 5. GCS motor subscore is 6.     Cranial Nerves: Cranial nerves 2-12 are intact.     Sensory: Sensation is intact.     Motor: Motor function is intact. No weakness or abnormal muscle  tone.     Coordination: Coordination is intact.  Psychiatric:        Mood and Affect: Mood normal.        Speech: Speech normal.        Behavior: Behavior normal.     ED Results / Procedures / Treatments   Labs (all labs ordered are listed, but only abnormal results are displayed) Labs Reviewed  SARS CORONAVIRUS 2 BY RT PCR  CBC WITH DIFFERENTIAL/PLATELET  BASIC METABOLIC PANEL  D-DIMER, QUANTITATIVE  TROPONIN I (HIGH SENSITIVITY)    EKG EKG Interpretation  Date/Time:  Wednesday December 19 2021 03:09:19 EDT Ventricular Rate:  68 PR Interval:  133 QRS Duration: 150 QT Interval:  460 QTC Calculation: 490 R Axis:   96 Text Interpretation: Ectopic atrial rhythm RBBB and LPFB No significant change since last tracing Confirmed by Gilda Crease [phone]) on 12/19/2021 3:25:37 AM  Radiology DG Shoulder Left  Result Date: 12/19/2021 CLINICAL DATA:  Left side and shoulder pain EXAM: LEFT SHOULDER - 2+ VIEW COMPARISON:  No prior shoulder radiographs FINDINGS: There is no evidence of fracture or dislocation. There is no evidence of arthropathy or other focal bone abnormality. Soft tissues are unremarkable. For chest findings, please see same-day chest radiograph report. IMPRESSION: Negative. Electronically Signed   By: Wiliam Ke M.D.   On: 12/19/2021 02:39   DG Chest 2 View  Result Date: 12/19/2021 CLINICAL DATA:  Pain radiating across left scapula and under left arm. EXAM: CHEST - 2 VIEW COMPARISON:  01/09/2005 FINDINGS: Status post median sternotomy and pulmonary valve replacement. 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 Cardiac and mediastinal contours are within normal limits. No focal pulmonary opacity. No pleural effusion or pneumothorax. No acute osseous abnormality. IMPRESSION: No acute cardiopulmonary process. Electronically Signed   By: Wiliam Ke M.D.   On: 12/19/2021 02:38    Procedures Procedures    Medications Ordered in  ED Medications  dexamethasone (DECADRON) injection 10 mg (has no administration in time range)  HYDROmorphone (DILAUDID) injection 1 mg (1 mg Intravenous Given 12/19/21 0228)  ondansetron (ZOFRAN) injection 4 mg (4 mg Intravenous Given 12/19/21 0229)  sodium chloride 0.9 % bolus 1,000 mL (0 mLs Intravenous Stopped 12/19/21 0433)  ketorolac (TORADOL) 30 MG/ML injection 15 mg (15 mg Intravenous Given 12/19/21 0229)    ED Course/ Medical Decision Making/ A&P                           Medical Decision Making Amount and/or Complexity of Data Reviewed Labs: ordered. Radiology: ordered.  Risk Prescription drug management.   Patient presents with left shoulder pain.  Pain appears to be musculoskeletal in nature.  He does have some pain with range of motion at the shoulder.  Distal to the shoulder, motor function is normal as is sensation.  Patient experiencing quite a bit of pain.  Work-up was initiated to rule out other medical conditions.  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  No acute changes.  Troponin negative.  D-dimer negative, doubt PE.  All the rest of his blood work was normal.  Vital signs are normal.  X-ray of the shoulder does not show anything acute.  He has preserved range of motion but does have pain with movement.  There also is some signs of instability of the shoulder as the shoulder clicks and pops when he moves in certain positions.  Pain seems to be mostly posterior to the shoulder, possibly rotator cuff tendinitis.  No clear tenderness at proximal bicep tendon or subacromial region, uncertain if shoulder injection would be helpful.  Sling for comfort.  Follow-up with orthopedics.  Analgesia provided.        Final Clinical Impression(s) / ED Diagnoses Final diagnoses:  Acute pain of left shoulder    Rx / DC Orders ED Discharge Orders  Ordered    oxyCODONE-acetaminophen  (PERCOCET) 5-325 MG tablet  Every 6 hours PRN        12/19/21 0445    ibuprofen (ADVIL) 800 MG tablet  Every 6 hours PRN        12/19/21 0445              Gilda Crease, MD 12/19/21 [phone]

## 2021-12-21 ENCOUNTER — Telehealth: Payer: Self-pay | Admitting: Orthopaedic Surgery

## 2021-12-21 ENCOUNTER — Encounter: Payer: Self-pay | Admitting: Orthopaedic Surgery

## 2021-12-21 ENCOUNTER — Ambulatory Visit (INDEPENDENT_AMBULATORY_CARE_PROVIDER_SITE_OTHER): Payer: Self-pay | Admitting: Orthopaedic Surgery

## 2021-12-21 VITALS — BP 113/76 | HR 89 | Temp 98.6°F | Ht 66.5 in | Wt 191.0 lb

## 2021-12-21 DIAGNOSIS — R768 Other specified abnormal immunological findings in serum: Secondary | ICD-10-CM

## 2021-12-21 DIAGNOSIS — M5412 Radiculopathy, cervical region: Secondary | ICD-10-CM | POA: Insufficient documentation

## 2021-12-21 DIAGNOSIS — M25512 Pain in left shoulder: Secondary | ICD-10-CM

## 2021-12-21 MED ORDER — PREDNISONE 10 MG (21) PO TBPK: ORAL_TABLET | ORAL | 0 refills | Status: DC

## 2021-12-21 MED ORDER — OXYCODONE-ACETAMINOPHEN 5-325 MG PO TABS: 1.0000 | ORAL_TABLET | Freq: Four times a day (QID) | ORAL | 0 refills | Status: DC | PRN

## 2021-12-21 NOTE — Telephone Encounter (Signed)
I called patient's mother and advised.

## 2021-12-21 NOTE — Telephone Encounter (Signed)
Patient's mom called. She says there is no instructions on how to take the prednisone. Would like someone to call her. [phone]

## 2021-12-21 NOTE — Progress Notes (Signed)
Office Visit Note   Patient: Andre Baker           Date of Birth: 08-07-1990           MRN: [id number] Visit Date: 12/21/2021              Requested by: Karle Starch, MD [address], [place],  [place] [address] PCP: Karle Starch, MD   Assessment & Plan: Visit Diagnoses:  1. Acute pain of left shoulder     Plan: Patient is able to reach overhead has excellent rotator cuff strength no shoulder instability.  Trapezius is normal.  No winging of the scapula.  Cervical spine exam is normal.  Discussed with his mother and recommended physical therapy single refill 12 tablets of the Percocet.  He is only just slightly better possibly with the sling and we will try a prednisone Dosepak to help.  Patient's mother is very concerned about a the amount of pain he is experiencing.  We will obtain arthritis panel although this does not suggest a rheumatologic inflammatory type problem.  Recheck 2 weeks.  Physical therapy ordered for his shoulder including modalities.  Follow-Up Instructions: Return in about 2 weeks (around 01/04/2022).   Orders:  Orders Placed This Encounter  Procedures   Uric acid   Sed Rate (ESR)   Rheumatoid Factor   Antinuclear Antib (ANA)   Ambulatory referral to Physical Therapy   Meds ordered this encounter  Medications   predniSONE (STERAPRED UNI-PAK 21 TAB) 10 MG (21) TBPK tablet    Sig: Take 6,5,4,3,2,1    Dispense:  21 tablet    Refill:  0   oxyCODONE-acetaminophen (PERCOCET) 5-325 MG tablet    Sig: Take 1 tablet by mouth every 6 (six) hours as needed for severe pain.    Dispense:  10 tablet    Refill:  0      Procedures: No procedures performed   Clinical Data: No additional findings.   Subjective: Chief Complaint  Patient presents with   Left Shoulder - Pain    HPI 31 year old male here with his mother with his left arm in a sling.  He said several visits to the ER has been taking oxycodone with slight  improvement.  He had a shot of Toradol in urgent care and also methocarbamol took ibuprofen stopped due to vomiting.  He has had IV Dilaudid which helped for 30 minutes Zofran, Toradol gabapentin.  Previous median sternotomy for tetralogy of Fallot.  He later had repeat sternotomy with valve replacement.  He states he cannot lean back in a chair.  When he moves his arm in a certain position up overhead and circumduction he notes some popping which she states actually makes it feel better and this is at the scapulothoracic junction.  Denies numbness or tingling in his fingers.  He states his pain is 10 out of 10 .  Review of Systems all other systems are noncontributory no history rheumatologic conditions or gout.   Objective: Vital Signs: BP 113/76   Pulse 89   Temp 98.6 F (37 C)   Ht 5' 6.5" (1.689 m)   Wt 191 lb (86.6 kg)   BMI 30.37 kg/m   Physical Exam Constitutional:      Appearance: He is well-developed.  HENT:     Head: Normocephalic and atraumatic.     Right Ear: External ear normal.     Left Ear: External ear normal.  Eyes:     Pupils:  Pupils are equal, round, and reactive to light.  Neck:     Thyroid: No thyromegaly.     Trachea: No tracheal deviation.  Cardiovascular:     Rate and Rhythm: Normal rate.  Pulmonary:     Effort: Pulmonary effort is normal.     Breath sounds: No wheezing.  Abdominal:     General: Bowel sounds are normal.     Palpations: Abdomen is soft.  Musculoskeletal:     Cervical back: Neck supple.  Skin:    General: Skin is warm and dry.     Capillary Refill: Capillary refill takes less than 2 seconds.  Neurological:     Mental Status: He is alert and oriented to person, place, and time.  Psychiatric:        Behavior: Behavior normal.        Thought Content: Thought content normal.        Judgment: Judgment normal.     Ortho Exam patient has median sternotomy he crosses his arms easily pulls his T-shirt up over his head after taking his  arm out of the sling.  He is able to reach up over his head.  Negative drop arm test minimal discomfort with internal rotation internal/external rotation is strong.  He states he gets relief actually feels better with brachial plexus palpation or trapezial palpation.  Slight pop noted with motion scapulothoracic left side only.  Upper extremity reflexes are 2+ no atrophy.  Normal heel-toe gait.  Specialty Comments:  No specialty comments available.  Imaging: No results found.   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    Family History  Problem Relation Age of Onset   Heart disease Other    Irritable bowel syndrome Other     Past Surgical History:  Procedure Laterality Date   lip biopsy     left lower   Pulmonary Valve Replacment [facility]/15/2015 at [facility]  05/15/2015   TETRALOGY OF FALLOT REPAIR     TYMPANOSTOMY TUBE PLACEMENT     Social History   Occupational History   Not on file  Tobacco Use   Smoking status: Never   Smokeless tobacco: Not on file  Vaping Use   Vaping Use: Never used  Substance and Sexual Activity   Alcohol use: No   Drug use: No   Sexual activity: Not on file

## 2021-12-24 LAB — ANTI-NUCLEAR AB-TITER (ANA TITER): ANA Titer 1: 1:320 {titer} — ABNORMAL HIGH

## 2021-12-24 LAB — URIC ACID: Uric Acid, Serum: 7.7 mg/dL (ref 4.0–8.0)

## 2021-12-24 LAB — RHEUMATOID FACTOR: Rheumatoid fact SerPl-aCnc: <14 IU/mL (ref <14)

## 2021-12-24 LAB — SEDIMENTATION RATE: Sed Rate: 28 mm/h — ABNORMAL HIGH (ref 0–15)

## 2021-12-24 LAB — ANA: Anti Nuclear Antibody (ANA): POSITIVE — AB

## 2021-12-25 ENCOUNTER — Telehealth: Payer: Self-pay | Admitting: Orthopaedic Surgery

## 2021-12-25 NOTE — Addendum Note (Signed)
Addended by: Rogers Seeds on: 12/25/2021 11:12 AM   Modules accepted: Orders

## 2021-12-25 NOTE — Telephone Encounter (Signed)
I called and spoke with patient's mom. Prednisone made him very irritable and he could not sleep.  Per Dr. Ophelia Charter, he can take 2/day until the medication runs out if that works better for patient.

## 2021-12-25 NOTE — Telephone Encounter (Signed)
Pt's mother Jola Babinski called requesting call back concerning dosage of prednisone for pt. She has questions. Please call pt's mother at [phone].

## 2022-01-03 ENCOUNTER — Ambulatory Visit: Payer: Self-pay | Admitting: Sports Medicine

## 2022-01-03 DIAGNOSIS — M542 Cervicalgia: Secondary | ICD-10-CM

## 2022-01-03 DIAGNOSIS — M5412 Radiculopathy, cervical region: Secondary | ICD-10-CM

## 2022-01-03 MED ORDER — GABAPENTIN 600 MG PO TABS: ORAL_TABLET | ORAL | 3 refills | Status: DC

## 2022-01-03 NOTE — Progress Notes (Signed)
    Procedures performed today:    None.  Independent interpretation of notes and tests performed by another provider:   None.  Brief History, Exam, Impression, and Recommendations:    Left cervical radiculopathy This is a very pleasant 31 year old male kindly referred from Dr. 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 He had several years of pain in his neck with radiation to the left periscapular region, down the arm mostly to the second and third fingers. Has had several treatments, multiple medications including steroids but was not really able to tolerate them for long. He has had some therapy of his left shoulder but not his cervical spine. On exam he has good reflexes but he does have some weakness to wrist dorsiflexion and finger abduction. I think the problem is referred from the cervical spine. We will add a cervical spine and thoracic spine MRI, he did request the thoracic spine MRI. We will increase his gabapentin to 600 mg nightly with an up taper. Adding cervical spine conditioning and he will talk to his therapist about adding cervical spine formal therapy. There are some complicating factors including chronic fatigue syndrome and depression which I advised him that we need to manage as well before we can get full control over his musculoskeletal complaints. He does plan to establish care with one of my partners for this. If we are able to make a diagnosis with his MRI we will follow this pathway, if the MRI is negative he will need a nerve conduction/EMG. Return to see me in 4 to 6 weeks.    ____________________________________________ Gwen Her. Dianah Field, M.D., ABFM., CAQSM., AME. Primary Care and Sports Medicine [facility]  Adjunct Professor of [place] [employer] [facility] [employer]  Risk manager

## 2022-01-03 NOTE — Assessment & Plan Note (Addendum)
This is a very pleasant 31 year old male kindly referred from Dr. 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 He had several years of pain in his neck with radiation to the left periscapular region, down the arm mostly to the second and third fingers. Has had several treatments, multiple medications including steroids but was not really able to tolerate them for long. He has had some therapy of his left shoulder but not his cervical spine. On exam he has good reflexes but he does have some weakness to wrist dorsiflexion and finger abduction. I think the problem is referred from the cervical spine. We will add a cervical spine and thoracic spine MRI, he did request the thoracic spine MRI. We will increase his gabapentin to 600 mg nightly with an up taper. Adding cervical spine conditioning and he will talk to his therapist about adding cervical spine formal therapy. There are some complicating factors including chronic fatigue syndrome and depression which I advised him that we need to manage as well before we can get full control over his musculoskeletal complaints. He does plan to establish care with one of my partners for this. If we are able to make a diagnosis with his MRI we will follow this pathway, if the MRI is negative he will need a nerve conduction/EMG. Return to see me in 4 to 6 weeks.

## 2022-01-04 ENCOUNTER — Telehealth: Payer: Self-pay

## 2022-01-04 MED ORDER — ONDANSETRON 8 MG PO TBDP: 8.0000 mg | ORAL_TABLET | Freq: Three times a day (TID) | ORAL | 3 refills | Status: DC | PRN

## 2022-01-04 NOTE — Telephone Encounter (Signed)
Patient called to inquire about the zofran prescription that was supposed to be sent in. He stated that the pharmacy did not get it.

## 2022-01-04 NOTE — Telephone Encounter (Signed)
Sent, that must have been my mistake.

## 2022-01-05 ENCOUNTER — Ambulatory Visit (INDEPENDENT_AMBULATORY_CARE_PROVIDER_SITE_OTHER): Payer: Self-pay

## 2022-01-05 DIAGNOSIS — M542 Cervicalgia: Secondary | ICD-10-CM

## 2022-01-05 DIAGNOSIS — M5412 Radiculopathy, cervical region: Secondary | ICD-10-CM

## 2022-01-07 ENCOUNTER — Telehealth: Payer: Self-pay

## 2022-01-07 NOTE — Telephone Encounter (Signed)
Patient called to inquire about the gabapentin dosing. He thought it was just going to go from 300mg  to 600mg  but the bottle has up-taper instructions. He did state that the 600mg  is making a difference and wants to know if it is OK to increase (take another 600mg  later in the evening) to help with sleeping?

## 2022-01-07 NOTE — Telephone Encounter (Signed)
He should follow the directions on the bottle, 600 mg nightly for a week then twice a day for a week then 3 times a day.

## 2022-01-13 ENCOUNTER — Ambulatory Visit (INDEPENDENT_AMBULATORY_CARE_PROVIDER_SITE_OTHER): Payer: Self-pay

## 2022-01-13 DIAGNOSIS — M5412 Radiculopathy, cervical region: Secondary | ICD-10-CM

## 2022-01-13 DIAGNOSIS — M546 Pain in thoracic spine: Secondary | ICD-10-CM

## 2022-01-28 ENCOUNTER — Encounter: Payer: Self-pay | Admitting: Sports Medicine

## 2022-01-28 ENCOUNTER — Ambulatory Visit (INDEPENDENT_AMBULATORY_CARE_PROVIDER_SITE_OTHER): Payer: Self-pay | Admitting: Sports Medicine

## 2022-01-28 DIAGNOSIS — M2550 Pain in unspecified joint: Secondary | ICD-10-CM | POA: Insufficient documentation

## 2022-01-28 DIAGNOSIS — M5412 Radiculopathy, cervical region: Secondary | ICD-10-CM

## 2022-01-28 DIAGNOSIS — Z13220 Encounter for screening for lipoid disorders: Secondary | ICD-10-CM

## 2022-01-28 MED ORDER — GABAPENTIN 800 MG PO TABS: 800.0000 mg | ORAL_TABLET | Freq: Three times a day (TID) | ORAL | 3 refills | Status: AC

## 2022-01-28 MED ORDER — GABAPENTIN 800 MG PO TABS: 800.0000 mg | ORAL_TABLET | Freq: Three times a day (TID) | ORAL | 3 refills | Status: DC

## 2022-01-28 NOTE — Assessment & Plan Note (Signed)
Andre Baker does have polyarthralgia, it sounds like he had an elevated sed rate with a previous provider. We will recheck this as well as a full rheumatoid work-up. He would like a consultation with rheumatology as well so I am happy to place this.

## 2022-01-28 NOTE — Patient Instructions (Signed)

## 2022-01-28 NOTE — Progress Notes (Signed)
    Procedures performed today:    None.  Independent interpretation of notes and tests performed by another provider:   None.  Brief History, Exam, Impression, and Recommendations:    Screening for hyperlipidemia I am going to order routine labs for Andre Baker however he understands that I will not be managing them, he does need to establish with a primary care provider.  Polyarthralgia Andre Baker does have polyarthralgia, it sounds like he had an elevated sed rate with a previous provider. We will recheck this as well as a full rheumatoid work-up. He would like a consultation with rheumatology as well so I am happy to place this.  Left cervical radiculopathy Andre Baker returns, he was referred to me by Dr. Nicola Police, another sports doc in the health system. 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 Over the several years prior to his visit with me he had pain in his neck with radiation to the left periscapular region, mostly down the arm to the second and third fingers, i.e. a C7 distribution. He did have several prior treatments, multiple medications including steroids. He has had therapy for his left shoulder but not the cervical spine. He had good reflexes on exam at the last visit with some weakness to wrist dorsiflexion and finger abduction, I did suspect a cervical spine process, we obtained a cervical and thoracic MRI, cervical spine MRI did show a disc protrusion C6-C7 impinging the left C7 nerve root. We increased gabapentin to 600 mg twice daily to 3 times daily and he has improved considerably. Increasing to 800 mg, he can continue chiropractic manipulation. Return to see me as needed. Next step would be increasing the gabapentin again versus proceeding with cervical epidural.    ____________________________________________ Gwen Her. Dianah Field, M.D., ABFM., CAQSM., AME. Primary Care and  Sports Medicine  [facility]  Adjunct Professor of [employer] [place] [employer] [facility] [employer] [employer]  Risk manager

## 2022-01-28 NOTE — Assessment & Plan Note (Signed)
I am going to order routine labs for Andre Baker however he understands that I will not be managing them, he does need to establish with a primary care provider.

## 2022-01-28 NOTE — Assessment & Plan Note (Signed)
Andre Baker returns, he was referred to me by Dr. Nicola Police, another sports doc in the health system. 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 Over the several years prior to his visit with me he had pain in his neck with radiation to the left periscapular region, mostly down the arm to the second and third fingers, i.e. a C7 distribution. He did have several prior treatments, multiple medications including steroids. He has had therapy for his left shoulder but not the cervical spine. He had good reflexes on exam at the last visit with some weakness to wrist dorsiflexion and finger abduction, I did suspect a cervical spine process, we obtained a cervical and thoracic MRI, cervical spine MRI did show a disc protrusion C6-C7 impinging the left C7 nerve root. We increased gabapentin to 600 mg twice daily to 3 times daily and he has improved considerably. Increasing to 800 mg, he can continue chiropractic manipulation. Return to see me as needed. Next step would be increasing the gabapentin again versus proceeding with cervical epidural.

## 2022-01-28 NOTE — Addendum Note (Signed)
Addended by: Dema Severin on: 01/28/2022 11:49 AM   Modules accepted: Orders

## 2022-02-01 LAB — CBC WITH DIFFERENTIAL/PLATELET
Absolute Monocytes: 430 cells/uL (ref 200–950)
Basophils Absolute: 60 cells/uL (ref 0–200)
Basophils Relative: 1.4 %
Eosinophils Absolute: 151 cells/uL (ref 15–500)
Eosinophils Relative: 3.5 %
HCT: 41.1 % (ref 38.5–50.0)
Hemoglobin: 13.7 g/dL (ref 13.2–17.1)
Lymphs Abs: 1097 cells/uL (ref 850–3900)
MCH: 29.7 pg (ref 27.0–33.0)
MCHC: 33.3 g/dL (ref 32.0–36.0)
MCV: 89.2 fL (ref 80.0–100.0)
MPV: 9.8 fL (ref 7.5–12.5)
Monocytes Relative: 10 %
Neutro Abs: 2563 cells/uL (ref 1500–7800)
Neutrophils Relative %: 59.6 %
Platelets: 206 10*3/uL (ref 140–400)
RBC: 4.61 10*6/uL (ref 4.20–5.80)
RDW: 13.1 % (ref 11.0–15.0)
Total Lymphocyte: 25.5 %
WBC: 4.3 10*3/uL (ref 3.8–10.8)

## 2022-02-01 LAB — TSH: TSH: 1.28 mIU/L (ref 0.40–4.50)

## 2022-02-01 LAB — LIPID PANEL
Cholesterol: 164 mg/dL (ref <200)
HDL: 56 mg/dL (ref >=40)
LDL Cholesterol (Calc): 87 mg/dL (calc)
Non-HDL Cholesterol (Calc): 108 mg/dL (calc) (ref <130)
Total CHOL/HDL Ratio: 2.9 (calc) (ref <5.0)
Triglycerides: 116 mg/dL (ref <150)

## 2022-02-01 LAB — LUPUS(12) PANEL
Anti Nuclear Antibody (ANA): POSITIVE — AB
C3 Complement: 125 mg/dL (ref 82–185)
C4 Complement: 25 mg/dL (ref 15–53)
ENA SM Ab Ser-aCnc: <1 AI
Rheumatoid fact SerPl-aCnc: <14 IU/mL (ref <14)
Ribosomal P Protein Ab: <1 AI
SM/RNP: <1 AI
SSA (Ro) (ENA) Antibody, IgG: <1 AI
SSB (La) (ENA) Antibody, IgG: <1 AI
Scleroderma (Scl-70) (ENA) Antibody, IgG: <1 AI
Thyroperoxidase Ab SerPl-aCnc: 1 IU/mL (ref <9)
ds DNA Ab: <1 IU/mL

## 2022-02-01 LAB — CYCLIC CITRUL PEPTIDE ANTIBODY, IGG: Cyclic Citrullin Peptide Ab: <16 UNITS

## 2022-02-01 LAB — ANTI-NUCLEAR AB-TITER (ANA TITER): ANA Titer 1: 1:320 {titer} — ABNORMAL HIGH

## 2022-02-01 LAB — CK: Total CK: 79 U/L (ref 44–196)

## 2022-02-01 LAB — COMPREHENSIVE METABOLIC PANEL
AG Ratio: 1.7 (calc) (ref 1.0–2.5)
ALT: 23 U/L (ref 9–46)
AST: 21 U/L (ref 10–40)
Albumin: 4.2 g/dL (ref 3.6–5.1)
Alkaline phosphatase (APISO): 44 U/L (ref 36–130)
BUN: 14 mg/dL (ref 7–25)
CO2: 26 mmol/L (ref 20–32)
Calcium: 9 mg/dL (ref 8.6–10.3)
Chloride: 103 mmol/L (ref 98–110)
Creat: 1.12 mg/dL (ref 0.60–1.24)
Globulin: 2.5 g/dL (calc) (ref 1.9–3.7)
Glucose, Bld: 83 mg/dL (ref 65–99)
Potassium: 4.7 mmol/L (ref 3.5–5.3)
Sodium: 138 mmol/L (ref 135–146)
Total Bilirubin: 0.4 mg/dL (ref 0.2–1.2)
Total Protein: 6.7 g/dL (ref 6.1–8.1)

## 2022-02-01 LAB — RHEUMATOID FACTOR (IGA, IGG, IGM)
Rheumatoid Factor (IgA): <5 U (ref <=6)
Rheumatoid Factor (IgG): <5 U (ref <=6)
Rheumatoid Factor (IgM): <5 U (ref <=6)

## 2022-02-01 LAB — HLA-B27 ANTIGEN: HLA-B27 Antigen: NEGATIVE

## 2022-02-01 LAB — HEMOGLOBIN A1C
Hgb A1c MFr Bld: 5.3 % of total Hgb (ref <5.7)
Mean Plasma Glucose: 105 mg/dL
eAG (mmol/L): 5.8 mmol/L

## 2022-02-01 LAB — SEDIMENTATION RATE: Sed Rate: 9 mm/h (ref 0–15)

## 2022-02-01 LAB — URIC ACID: Uric Acid, Serum: 8.3 mg/dL — ABNORMAL HIGH (ref 4.0–8.0)

## 2022-02-22 ENCOUNTER — Encounter: Payer: Self-pay | Admitting: Sports Medicine

## 2022-02-26 NOTE — Telephone Encounter (Signed)
Yes, okay to refill Zofran.

## 2022-02-27 MED ORDER — ONDANSETRON 8 MG PO TBDP: 8.0000 mg | ORAL_TABLET | Freq: Three times a day (TID) | ORAL | 3 refills | Status: AC | PRN

## 2022-02-27 MED ORDER — ONDANSETRON 8 MG PO TBDP: 8.0000 mg | ORAL_TABLET | Freq: Three times a day (TID) | ORAL | 3 refills | Status: DC | PRN

## 2022-02-27 NOTE — Addendum Note (Signed)
Addended by: Elizabeth Palau on: 02/27/2022 11:05 AM   Modules accepted: Orders

## 2022-05-20 ENCOUNTER — Encounter: Payer: Medicaid Other | Admitting: Internal Medicine

## 2022-09-01 NOTE — Progress Notes (Signed)
Office Visit Note  Patient: Andre Baker             Date of Birth: Jul 06, 1990           MRN: [id number]             PCP: Chari Manning, MD Referring: Eldred Manges, MD Visit Date: 09/02/2022   Subjective:  New Patient (Initial Visit) (Patient states he is going to see cardiologist and will get an Echo soon. Patient states recently he has had heart racing, pounding, and palpitations for about two weeks.)   History of Present Illness: Andre Baker is a 32 y.o. male here for evaluation of positive ANA associated with multiple chronic symptoms including chronic fatigue, joint pains, photosensitivity, and noticing some vascular phenomenon with discoloration in both feet.  He also has extensive cardiac history with multiple surgeries and frequent follow-up due to tetralogy of Fallot anatomy. Experiences joint and muscle pains chronically most often in the areas around shoulder and upper chest.  Previously saw Dr. Ophelia Charter for this pain noted excellent range of motion and strength did not get much relief with initial conservative management did feel more improvement on course of oral steroids.  Also saw Dr. Benjamin Stain had MRI of cervical thoracic spine did show some disc protrusion and foraminal stenosis suspicious for referred symptoms and was treated with gabapentin. He reports persistent fatigue low energy and delay getting up and started most days this problem has been persistent for years.  Does have some excessive daytime somnolence previously had in lab sleep study years ago that was poor quality due to excessive sleep latency.  He sometimes takes melatonin as a sleep aid but is sometimes associated with left over grogginess into the following morning. Does have chronic anxiety and sometimes depressed mood but he does not think this is the underlying cause as fatigue occurs often in absence of these symptoms. He experiences skin rashes frequently on the face.  His eyes are  pretty sensitive to bright light but does not specifically notice skin inflammation effect.  He has allergy symptoms to numerous environmental exposures previous formal testing was pretty bland.  Has chronically dry eyes does not use any specific medication or local treatment for this. Frequently sees bluish purpleish discoloration involving both feet.  Does not get similar color change frequently in his hands.  Never associated with residual ulcers blistering or pitting. Does not report alopecia, oral nasal ulcers, or history of abnormal bleeding and blood clots.  Labs reviewed 01/2022 ANA 1:320 fine speckled dsDNA, RNP, Sm, SSA, SSB, TPO, Scl-70 neg Complement C3 C4 wnl RF neg CCP neg ESR wnl Uric acid 8.3 CK 79  12/2021 ANA 1:320 speckled ESR 28 RF neg Uric acid wnl  Activities of Daily Living:  Patient reports morning stiffness for 0 minute.   Patient Denies nocturnal pain.  Difficulty dressing/grooming: Denies Difficulty climbing stairs: Denies Difficulty getting out of chair: Denies Difficulty using hands for taps, buttons, cutlery, and/or writing: Denies  Review of Systems  Constitutional:  Positive for fatigue.  HENT:  Positive for mouth dryness. Negative for mouth sores.   Eyes:  Positive for dryness.  Respiratory:  Positive for shortness of breath.   Cardiovascular:  Positive for chest pain and palpitations.  Gastrointestinal:  Positive for blood in stool and diarrhea. Negative for constipation.  Endocrine: Positive for increased urination.  Genitourinary:  Positive for involuntary urination.  Musculoskeletal:  Positive for joint pain, joint pain, myalgias, muscle tenderness and myalgias.  Negative for gait problem, joint swelling, muscle weakness and morning stiffness.  Skin:  Positive for sensitivity to sunlight. Negative for color change, rash and hair loss.  Allergic/Immunologic: Positive for susceptible to infections.  Neurological:  Positive for dizziness and  headaches.  Hematological:  Positive for swollen glands.  Psychiatric/Behavioral:  Positive for sleep disturbance. Negative for depressed mood. The patient is nervous/anxious.     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   Hx of heart surgery 06/16/2014   Tetralogy of Fallot 06/16/2014   GERD (gastroesophageal reflux disease) 02/25/2011    Past Medical History:  Diagnosis Date   Allergic rhinitis    Anxiety    Blue baby    Chronic headaches    Depression    Fatigue    GERD (gastroesophageal reflux disease)    IBS (irritable bowel syndrome)    OCD (obsessive compulsive disorder)    PTSD (post-traumatic stress disorder)    Sleep apnea    Tetralogy of Fallot    s/p surgical correction as a baby    Family History  Problem Relation Age of Onset   Heart disease Other    Irritable bowel syndrome Other    Past Surgical History:  Procedure Laterality Date   lip biopsy     left lower   Pulmonary Valve Replacment [facility]/15/2015 at [facility]  05/15/2015   TETRALOGY OF FALLOT REPAIR     TYMPANOSTOMY TUBE PLACEMENT     Social History   Social History Narrative   Not on file   Immunization History  Administered Date(s) Administered   Influenza Split 01/14/2015     Objective: Vital Signs: BP 115/77 (BP Location: Right Arm, Patient Position: Sitting, Cuff Size: Normal)   Pulse 73   Resp 14   Ht 5' 6.25" (1.683 m)   Wt 197 lb (89.4 kg)   BMI 31.56 kg/m    Physical  Exam HENT:     Mouth/Throat:     Mouth: Mucous membranes are moist.     Pharynx: Oropharynx is clear.  Eyes:     Conjunctiva/sclera: Conjunctivae normal.  Cardiovascular:     Rate and Rhythm: Normal rate and regular rhythm.     Heart sounds: Murmur heard.  Pulmonary:     Effort: Pulmonary effort is normal.     Breath sounds: Normal breath sounds.  Musculoskeletal:     Right lower leg: No edema.     Left lower leg: No edema.  Lymphadenopathy:     Cervical: No cervical adenopathy.  Skin:    General: Skin is warm and dry.     Findings: No rash.     Comments: Slight violaceous discoloration in toes in dependent position, capillary refill about 3 seconds Normal appearing nailfold capillaries  Neurological:     Mental Status: He is alert.  Psychiatric:        Mood and Affect: Mood normal.      Musculoskeletal Exam:  Neck full ROM no tenderness Shoulders full ROM clicking with movement in full external rotation while at horizontal  abduction Elbows full ROM no tenderness or swelling Wrists full ROM no tenderness or swelling Fingers full ROM no tenderness or swelling Knees full ROM no tenderness or swelling Ankles full ROM no tenderness or swelling MTPs full ROM no tenderness or swelling   Investigation: No additional findings.  Imaging: No results found.  Recent Labs: Lab Results  Component Value Date   WBC 4.3 01/28/2022   HGB 13.7 01/28/2022   PLT 206 01/28/2022   NA 138 01/28/2022   K 4.7 01/28/2022   CL 103 01/28/2022   CO2 26 01/28/2022   GLUCOSE 83 01/28/2022   BUN 14 01/28/2022   CREATININE 1.12 01/28/2022   BILITOT 0.4 01/28/2022   ALKPHOS 61 12/19/2012   AST 21 01/28/2022   ALT 23 01/28/2022   PROT 6.7 01/28/2022   ALBUMIN 4.1 12/19/2012   CALCIUM 9.0 01/28/2022   GFRAA >90 12/19/2012    Speciality Comments: No specialty comments available.  Procedures:  No procedures performed Allergies: Benzodiazepines, Casein, Codeine, Corn-containing  products, Dairycare [lactase-lactobacillus], Gluten meal, Imipramine, and Zithromax [azithromycin]   Assessment / Plan:     Visit Diagnoses: Positive ANA (antinuclear antibody) - Plan: Chromatin (Nucleosomal) Antibody, Sedimentation rate, Beta-2 glycoprotein antibodies, Cardiolipin antibodies, IgG, IgM, IgA, Lupus Anticoagulant Eval w/Reflex, CANCELED: IgG, IgA, IgM  Moderately high positive ANA he has multiple ongoing chronic symptoms but no particular clinical criteria for lupus or other autoimmune connective tissue disease.  The discoloration in his feet is atypical for Raynaud's probably more related to his cardiovascular history.  May have some component of chronic myofascial pain also describes a fair amount of anxiety and sleep disruption.  Checking serum inflammatory markers also antiphospholipid antibody markers and chromatin for any evidence of systemic inflammation as a cause of joint pain or his circulation change.  Polyarthralgia  Joint pain in multiple areas though the worst problems in his shoulder region may also be explained by the previously identified cervical spine degenerative changes. Also may have some scapular and shoulder dysfunction.  Workup as above no specific inflammation appreciated on exam.  Orders: Orders Placed This Encounter  Procedures   Chromatin (Nucleosomal) Antibody   Sedimentation rate   Beta-2 glycoprotein antibodies   Cardiolipin antibodies, IgG, IgM, IgA   Lupus Anticoagulant Eval w/Reflex   No orders of the defined types were placed in this encounter.   Follow-Up Instructions: No follow-ups on file.   Fuller Plan, MD  Note - This record has been created using [employer].  Chart creation errors have been sought, but may not always  have been located. Such creation errors do not reflect on  the standard of medical care.

## 2022-09-02 ENCOUNTER — Ambulatory Visit: Payer: Medicaid Other | Attending: Internal Medicine | Admitting: Internal Medicine

## 2022-09-02 ENCOUNTER — Encounter: Payer: Self-pay | Admitting: Internal Medicine

## 2022-09-02 VITALS — BP 115/77 | HR 73 | Resp 14 | Ht 66.25 in | Wt 197.0 lb

## 2022-09-02 DIAGNOSIS — R899 Unspecified abnormal finding in specimens from other organs, systems and tissues: Secondary | ICD-10-CM

## 2022-09-02 DIAGNOSIS — M2550 Pain in unspecified joint: Secondary | ICD-10-CM

## 2022-09-02 DIAGNOSIS — R768 Other specified abnormal immunological findings in serum: Secondary | ICD-10-CM

## 2022-09-07 LAB — CHROMATIN (NUCLEOSOMAL) ANTIBODY: Chromatin (Nucleosomal) Antibody: <1 AI

## 2022-09-07 LAB — BETA-2 GLYCOPROTEIN ANTIBODIES
Beta-2 Glyco 1 IgA: 3.6 U/mL (ref <20.0)
Beta-2 Glyco 1 IgM: <2 U/mL (ref <20.0)
Beta-2 Glyco I IgG: <2 U/mL (ref <20.0)

## 2022-09-07 LAB — CARDIOLIPIN ANTIBODIES, IGG, IGM, IGA
Anticardiolipin IgA: 4 APL-U/mL (ref <20.0)
Anticardiolipin IgG: <2 GPL-U/mL (ref <20.0)
Anticardiolipin IgM: <2 MPL-U/mL (ref <20.0)

## 2022-09-07 LAB — LUPUS ANTICOAGULANT EVAL W/ REFLEX
PTT-LA Screen: 39 s (ref <=40)
dRVVT: 38 s (ref <=45)

## 2022-09-07 LAB — SEDIMENTATION RATE: Sed Rate: 9 mm/h (ref 0–15)

## 2022-09-10 DIAGNOSIS — I371 Nonrheumatic pulmonary valve insufficiency: Secondary | ICD-10-CM | POA: Diagnosis not present

## 2022-09-10 DIAGNOSIS — Q213 Tetralogy of Fallot: Secondary | ICD-10-CM | POA: Diagnosis not present

## 2022-09-10 DIAGNOSIS — Z953 Presence of xenogenic heart valve: Secondary | ICD-10-CM | POA: Diagnosis not present

## 2022-09-26 DIAGNOSIS — R0789 Other chest pain: Secondary | ICD-10-CM | POA: Diagnosis not present

## 2022-09-26 DIAGNOSIS — R002 Palpitations: Secondary | ICD-10-CM | POA: Diagnosis not present

## 2022-09-26 DIAGNOSIS — I4510 Unspecified right bundle-branch block: Secondary | ICD-10-CM | POA: Diagnosis not present

## 2022-09-26 DIAGNOSIS — R079 Chest pain, unspecified: Secondary | ICD-10-CM | POA: Diagnosis not present

## 2022-09-26 DIAGNOSIS — R42 Dizziness and giddiness: Secondary | ICD-10-CM | POA: Diagnosis not present

## 2022-10-01 DIAGNOSIS — R002 Palpitations: Secondary | ICD-10-CM | POA: Diagnosis not present

## 2022-10-01 DIAGNOSIS — Z953 Presence of xenogenic heart valve: Secondary | ICD-10-CM | POA: Diagnosis not present

## 2022-10-01 DIAGNOSIS — R0602 Shortness of breath: Secondary | ICD-10-CM | POA: Diagnosis not present

## 2022-10-01 DIAGNOSIS — Q213 Tetralogy of Fallot: Secondary | ICD-10-CM | POA: Diagnosis not present

## 2022-10-01 DIAGNOSIS — I371 Nonrheumatic pulmonary valve insufficiency: Secondary | ICD-10-CM | POA: Diagnosis not present

## 2023-01-30 ENCOUNTER — Ambulatory Visit: Payer: Medicaid Other | Admitting: Sports Medicine

## 2023-12-16 ENCOUNTER — Encounter: Payer: Self-pay | Admitting: Sports Medicine
